# Patient Record
Sex: Female | Born: 1960 | Race: Black or African American | Hispanic: No | Marital: Single | Smoking: Former smoker
Health system: Southern US, Community
[De-identification: ages and names within clinical notes are randomized; demographics above are authoritative.]

## PROBLEM LIST (undated history)

## (undated) DIAGNOSIS — F419 Anxiety disorder, unspecified: Secondary | ICD-10-CM

## (undated) DIAGNOSIS — F32A Depression, unspecified: Secondary | ICD-10-CM

## (undated) DIAGNOSIS — I10 Essential (primary) hypertension: Secondary | ICD-10-CM

## (undated) DIAGNOSIS — E119 Type 2 diabetes mellitus without complications: Secondary | ICD-10-CM

## (undated) DIAGNOSIS — F329 Major depressive disorder, single episode, unspecified: Secondary | ICD-10-CM

## (undated) DIAGNOSIS — J45909 Unspecified asthma, uncomplicated: Secondary | ICD-10-CM

## (undated) DIAGNOSIS — E7800 Pure hypercholesterolemia, unspecified: Secondary | ICD-10-CM

## (undated) HISTORY — PX: ABDOMINAL HYSTERECTOMY: SHX81

## (undated) HISTORY — PX: TUBAL LIGATION: SHX77

## (undated) HISTORY — PX: BUNIONECTOMY: SHX129

## (undated) HISTORY — PX: CHOLECYSTECTOMY: SHX55

## (undated) HISTORY — PX: OTHER SURGICAL HISTORY: SHX169

---
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
## 2000-10-02 ENCOUNTER — Emergency Department (HOSPITAL_COMMUNITY): Admission: EM | Admit: 2000-10-02 | Discharge: 2000-10-02 | Payer: Self-pay | Admitting: *Deleted

## 2000-11-04 ENCOUNTER — Encounter: Payer: Self-pay | Admitting: Emergency Medicine

## 2000-11-04 ENCOUNTER — Emergency Department (HOSPITAL_COMMUNITY): Admission: EM | Admit: 2000-11-04 | Discharge: 2000-11-04 | Payer: Self-pay | Admitting: Emergency Medicine

## 2001-02-03 ENCOUNTER — Encounter: Payer: Self-pay | Admitting: Emergency Medicine

## 2001-02-03 ENCOUNTER — Emergency Department (HOSPITAL_COMMUNITY): Admission: EM | Admit: 2001-02-03 | Discharge: 2001-02-03 | Payer: Self-pay | Admitting: Emergency Medicine

## 2001-09-23 ENCOUNTER — Ambulatory Visit (HOSPITAL_COMMUNITY): Admission: RE | Admit: 2001-09-23 | Discharge: 2001-09-23 | Payer: Self-pay | Admitting: Obstetrics

## 2002-07-11 ENCOUNTER — Encounter: Payer: Self-pay | Admitting: *Deleted

## 2002-07-11 ENCOUNTER — Emergency Department (HOSPITAL_COMMUNITY): Admission: EM | Admit: 2002-07-11 | Discharge: 2002-07-11 | Payer: Self-pay | Admitting: *Deleted

## 2002-07-12 ENCOUNTER — Ambulatory Visit (HOSPITAL_COMMUNITY): Admission: RE | Admit: 2002-07-12 | Discharge: 2002-07-12 | Payer: Self-pay | Admitting: *Deleted

## 2002-07-12 ENCOUNTER — Encounter: Payer: Self-pay | Admitting: *Deleted

## 2002-07-15 ENCOUNTER — Other Ambulatory Visit: Admission: RE | Admit: 2002-07-15 | Discharge: 2002-07-15 | Payer: Self-pay | Admitting: Obstetrics

## 2002-09-25 ENCOUNTER — Emergency Department (HOSPITAL_COMMUNITY): Admission: EM | Admit: 2002-09-25 | Discharge: 2002-09-25 | Payer: Self-pay | Admitting: Emergency Medicine

## 2003-03-29 ENCOUNTER — Encounter: Payer: Self-pay | Admitting: Emergency Medicine

## 2003-03-29 ENCOUNTER — Emergency Department (HOSPITAL_COMMUNITY): Admission: EM | Admit: 2003-03-29 | Discharge: 2003-03-29 | Payer: Self-pay | Admitting: Emergency Medicine

## 2003-05-18 ENCOUNTER — Encounter: Admission: RE | Admit: 2003-05-18 | Discharge: 2003-05-18 | Payer: Self-pay | Admitting: Internal Medicine

## 2003-05-18 ENCOUNTER — Encounter: Payer: Self-pay | Admitting: Internal Medicine

## 2003-07-11 ENCOUNTER — Encounter: Payer: Self-pay | Admitting: Surgery

## 2003-07-19 ENCOUNTER — Observation Stay (HOSPITAL_COMMUNITY): Admission: RE | Admit: 2003-07-19 | Discharge: 2003-07-20 | Payer: Self-pay | Admitting: Surgery

## 2003-07-19 ENCOUNTER — Encounter: Payer: Self-pay | Admitting: Surgery

## 2003-12-26 ENCOUNTER — Emergency Department (HOSPITAL_COMMUNITY): Admission: EM | Admit: 2003-12-26 | Discharge: 2003-12-26 | Payer: Self-pay | Admitting: Emergency Medicine

## 2004-02-16 ENCOUNTER — Emergency Department (HOSPITAL_COMMUNITY): Admission: EM | Admit: 2004-02-16 | Discharge: 2004-02-16 | Payer: Self-pay | Admitting: Emergency Medicine

## 2004-02-29 ENCOUNTER — Emergency Department (HOSPITAL_COMMUNITY): Admission: EM | Admit: 2004-02-29 | Discharge: 2004-02-29 | Payer: Self-pay | Admitting: Emergency Medicine

## 2004-06-17 ENCOUNTER — Emergency Department (HOSPITAL_COMMUNITY): Admission: EM | Admit: 2004-06-17 | Discharge: 2004-06-17 | Payer: Self-pay | Admitting: Emergency Medicine

## 2004-11-07 ENCOUNTER — Ambulatory Visit: Payer: Self-pay | Admitting: Internal Medicine

## 2004-12-21 ENCOUNTER — Ambulatory Visit: Payer: Self-pay | Admitting: Internal Medicine

## 2004-12-24 ENCOUNTER — Ambulatory Visit (HOSPITAL_COMMUNITY): Admission: RE | Admit: 2004-12-24 | Discharge: 2004-12-24 | Payer: Self-pay | Admitting: Internal Medicine

## 2005-01-01 ENCOUNTER — Ambulatory Visit: Payer: Self-pay | Admitting: Family Medicine

## 2005-01-02 ENCOUNTER — Ambulatory Visit (HOSPITAL_COMMUNITY): Admission: RE | Admit: 2005-01-02 | Discharge: 2005-01-02 | Payer: Self-pay | Admitting: Family Medicine

## 2005-03-26 ENCOUNTER — Ambulatory Visit: Payer: Self-pay | Admitting: Internal Medicine

## 2005-05-06 ENCOUNTER — Emergency Department (HOSPITAL_COMMUNITY): Admission: EM | Admit: 2005-05-06 | Discharge: 2005-05-06 | Payer: Self-pay | Admitting: Emergency Medicine

## 2005-05-07 ENCOUNTER — Emergency Department (HOSPITAL_COMMUNITY): Admission: EM | Admit: 2005-05-07 | Discharge: 2005-05-07 | Payer: Self-pay | Admitting: Emergency Medicine

## 2005-07-30 ENCOUNTER — Ambulatory Visit: Payer: Self-pay | Admitting: Internal Medicine

## 2005-08-28 ENCOUNTER — Ambulatory Visit: Payer: Self-pay | Admitting: Internal Medicine

## 2018-07-23 ENCOUNTER — Emergency Department (HOSPITAL_COMMUNITY)
Admission: EM | Admit: 2018-07-23 | Discharge: 2018-07-23 | Disposition: A | Discharge status: Home / Self Care | Payer: Medicare HMO | Attending: Emergency Medicine | Admitting: Emergency Medicine

## 2018-07-23 ENCOUNTER — Encounter (HOSPITAL_COMMUNITY): Payer: Self-pay | Admitting: *Deleted

## 2018-07-23 ENCOUNTER — Emergency Department (HOSPITAL_COMMUNITY): Payer: Medicare HMO

## 2018-07-23 ENCOUNTER — Other Ambulatory Visit: Payer: Self-pay

## 2018-07-23 DIAGNOSIS — I159 Secondary hypertension, unspecified: Secondary | ICD-10-CM | POA: Diagnosis not present

## 2018-07-23 DIAGNOSIS — G43009 Migraine without aura, not intractable, without status migrainosus: Secondary | ICD-10-CM

## 2018-07-23 DIAGNOSIS — R51 Headache: Secondary | ICD-10-CM | POA: Diagnosis not present

## 2018-07-23 HISTORY — DX: Essential (primary) hypertension: I10

## 2018-07-23 LAB — COMPREHENSIVE METABOLIC PANEL
ALBUMIN: 4.1 g/dL (ref 3.5–5.0)
ALT: 23 U/L (ref 0–44)
AST: 23 U/L (ref 15–41)
Alkaline Phosphatase: 76 U/L (ref 38–126)
Anion gap: 8 (ref 5–15)
BILIRUBIN TOTAL: 0.6 mg/dL (ref 0.3–1.2)
BUN: 17 mg/dL (ref 6–20)
CALCIUM: 9.3 mg/dL (ref 8.9–10.3)
CO2: 29 mmol/L (ref 22–32)
Chloride: 106 mmol/L (ref 98–111)
Creatinine, Ser: 0.77 mg/dL (ref 0.44–1.00)
GFR calc Af Amer: >60 mL/min (ref >60)
GLUCOSE: 135 mg/dL — AB (ref 70–99)
POTASSIUM: 3.8 mmol/L (ref 3.5–5.1)
Sodium: 143 mmol/L (ref 135–145)
TOTAL PROTEIN: 7.7 g/dL (ref 6.5–8.1)

## 2018-07-23 LAB — CBC
HEMATOCRIT: 39.5 % (ref 36.0–46.0)
HEMOGLOBIN: 13.1 g/dL (ref 12.0–15.0)
MCH: 27.9 pg (ref 26.0–34.0)
MCHC: 33.2 g/dL (ref 30.0–36.0)
MCV: 84 fL (ref 78.0–100.0)
Platelets: 311 10*3/uL (ref 150–400)
RBC: 4.7 MIL/uL (ref 3.87–5.11)
RDW: 13.8 % (ref 11.5–15.5)
WBC: 6 10*3/uL (ref 4.0–10.5)

## 2018-07-23 LAB — DIFFERENTIAL
BASOS ABS: 0 10*3/uL (ref 0.0–0.1)
Basophils Relative: 0 %
Eosinophils Absolute: 0.1 10*3/uL (ref 0.0–0.7)
Eosinophils Relative: 2 %
LYMPHS ABS: 2.2 10*3/uL (ref 0.7–4.0)
LYMPHS PCT: 37 %
MONOS PCT: 9 %
Monocytes Absolute: 0.6 10*3/uL (ref 0.1–1.0)
NEUTROS PCT: 52 %
Neutro Abs: 3.1 10*3/uL (ref 1.7–7.7)

## 2018-07-23 LAB — I-STAT BETA HCG BLOOD, ED (MC, WL, AP ONLY): I-stat hCG, quantitative: <5 m[IU]/mL (ref <5)

## 2018-07-23 LAB — I-STAT CHEM 8, ED
BUN: 17 mg/dL (ref 6–20)
Calcium, Ion: 1.13 mmol/L — ABNORMAL LOW (ref 1.15–1.40)
Chloride: 103 mmol/L (ref 98–111)
Creatinine, Ser: 0.7 mg/dL (ref 0.44–1.00)
Glucose, Bld: 132 mg/dL — ABNORMAL HIGH (ref 70–99)
HEMATOCRIT: 39 % (ref 36.0–46.0)
Hemoglobin: 13.3 g/dL (ref 12.0–15.0)
Potassium: 3.6 mmol/L (ref 3.5–5.1)
Sodium: 140 mmol/L (ref 135–145)
TCO2: 29 mmol/L (ref 22–32)

## 2018-07-23 LAB — I-STAT TROPONIN, ED
TROPONIN I, POC: 0 ng/mL (ref 0.00–0.08)
Troponin i, poc: 0.01 ng/mL (ref 0.00–0.08)

## 2018-07-23 LAB — PREGNANCY, URINE: PREG TEST UR: NEGATIVE

## 2018-07-23 LAB — APTT: APTT: 29 s (ref 24–36)

## 2018-07-23 LAB — PROTIME-INR
INR: 1
Prothrombin Time: 13.1 seconds (ref 11.4–15.2)

## 2018-07-23 MED ORDER — DIPHENHYDRAMINE HCL 25 MG PO CAPS
25.0000 mg | ORAL_CAPSULE | Freq: Once | ORAL | Status: AC
  Administered 2018-07-23: 25 mg via ORAL
  Filled 2018-07-23: qty 1

## 2018-07-23 MED ORDER — PROCHLORPERAZINE EDISYLATE 10 MG/2ML IJ SOLN
10.0000 mg | Freq: Once | INTRAMUSCULAR | Status: AC
  Administered 2018-07-23: 10 mg via INTRAVENOUS
  Filled 2018-07-23: qty 2

## 2018-07-23 MED ORDER — DEXAMETHASONE 4 MG PO TABS
10.0000 mg | ORAL_TABLET | Freq: Once | ORAL | Status: AC
  Administered 2018-07-23: 10 mg via ORAL
  Filled 2018-07-23: qty 2

## 2018-07-23 MED ORDER — KETOROLAC TROMETHAMINE 15 MG/ML IJ SOLN
15.0000 mg | Freq: Once | INTRAMUSCULAR | Status: AC
  Administered 2018-07-23: 15 mg via INTRAVENOUS
  Filled 2018-07-23: qty 1

## 2018-07-23 MED ORDER — AMLODIPINE BESYLATE 5 MG PO TABS: 5.0000 mg | ORAL_TABLET | Freq: Every day | ORAL | 0 refills | Status: DC

## 2018-07-23 NOTE — ED Triage Notes (Signed)
Pt reports elevated BP with severe h/a x 3 days ago.  She ran out of her HTN meds 4 months ago.  She is A&Ox 4.  Pt reports "stumbling" when ambulating x 2 days.  No slurred speech or facial droop.  Reports L side weakness x 2 days.  BP 180/112.  She reports the reason she stopped taking her HTN meds is because she did not have a doctor.

## 2018-07-23 NOTE — ED Provider Notes (Signed)
[facility]-EMERGENCY DEPT Provider Note   CSN: [id number] Arrival date & time: 07/23/18  1519     History   Chief Complaint Chief Complaint  Patient presents with  . Headache  . Hypertension    HPI Diana ARSENEAU is a 57 y.o. female.   The history is provided by the patient.   Headache    This is a recurrent problem. The current episode started more than 2 days ago. The problem occurs hourly. The problem has been gradually worsening. The headache is associated with emotional stress (hypertension). The pain is located in the bilateral region. The quality of the pain is described as dull. The pain is at a severity of 5/10. The pain is moderate. The pain does not radiate. Pertinent negatives include no anorexia, no fever, no malaise/fatigue, no chest pressure, no near-syncope, no orthopnea, no palpitations, no syncope, no shortness of breath, no nausea and no vomiting. She has tried nothing for the symptoms. The treatment provided no relief.    Past Medical History:  Diagnosis Date  . Hypertension     There are no active problems to display for this patient.   History reviewed. No pertinent surgical history.   OB History   None      Home Medications    Prior to Admission medications   Medication Sig Start Date End Date Taking? Authorizing Provider  amLODipine (NORVASC) 5 MG tablet Take 1 tablet (5 mg total) by mouth daily. 07/23/18 08/22/18  Virgina Norfolk, DO    Family History No family history on file.  Social History Social History   Tobacco Use  . Smoking status: Never Smoker  . Smokeless tobacco: Never Used  Substance Use Topics  . Alcohol use: Never    Frequency: Never  . Drug use: Never     Allergies   Patient has no known allergies.   Review of Systems Review of Systems  Constitutional: Negative for chills, fever and malaise/fatigue.  HENT: Negative for ear pain, sinus pressure, sinus pain and sore throat.   Eyes:  Negative for pain and visual disturbance.  Respiratory: Negative for cough and shortness of breath.   Cardiovascular: Negative for chest pain, palpitations, orthopnea, syncope and near-syncope.  Gastrointestinal: Negative for abdominal pain, anorexia, nausea and vomiting.  Genitourinary: Negative for dysuria and hematuria.  Musculoskeletal: Negative for arthralgias and back pain.  Skin: Negative for color change and rash.  Neurological: Positive for headaches. Negative for dizziness, tremors, seizures, syncope, facial asymmetry, speech difficulty, weakness, light-headedness and numbness.  All other systems reviewed and are negative.    Physical Exam Updated Vital Signs  ED Triage Vitals  Enc Vitals Group     BP 07/23/18 1534 (!) 198/112     Pulse Rate 07/23/18 1534 84     Resp 07/23/18 1534 20     Temp 07/23/18 1534 99.2 F (37.3 C)     Temp Source 07/23/18 1534 Oral     SpO2 07/23/18 1534 100 %     Weight 07/23/18 1554 154 lb (69.9 kg)     Height 07/23/18 1554 5\' 8"  (1.727 m)     Head Circumference --      Peak Flow --      Pain Score 07/23/18 1554 10     Pain Loc --      Pain Edu? --      Excl. in GC? --     Physical Exam  Constitutional: She is oriented to person, place, and time.  She appears well-developed and well-nourished. No distress.  HENT:  Head: Normocephalic and atraumatic.  Eyes: Pupils are equal, round, and reactive to light. Conjunctivae and EOM are normal.  Neck: Normal range of motion. Neck supple.  Cardiovascular: Normal rate and regular rhythm.  No murmur heard. Pulmonary/Chest: Effort normal and breath sounds normal. No respiratory distress.  Abdominal: Soft. There is no tenderness.  Musculoskeletal: She exhibits no edema.  Neurological: She is alert and oriented to person, place, and time. She has normal strength. She is not disoriented. No cranial nerve deficit or sensory deficit.  5+/5 strength, normal sensation, no drift, normal finger to nose  finger  Skin: Skin is warm and dry.  Psychiatric: She has a normal mood and affect.  Nursing note and vitals reviewed.    ED Treatments / Results  Labs (all labs ordered are listed, but only abnormal results are displayed) Labs Reviewed  COMPREHENSIVE METABOLIC PANEL - Abnormal; Notable for the following components:      Result Value   Glucose, Bld 135 (*)    All other components within normal limits  I-STAT CHEM 8, ED - Abnormal; Notable for the following components:   Glucose, Bld 132 (*)    Calcium, Ion 1.13 (*)    All other components within normal limits  PROTIME-INR  APTT  CBC  DIFFERENTIAL  PREGNANCY, URINE  I-STAT TROPONIN, ED  CBG MONITORING, ED  I-STAT BETA HCG BLOOD, ED ([facility], [facility], [facility] ONLY)  I-STAT TROPONIN, ED    EKG EKG Interpretation  Date/Time:  Thursday July 23 2018 16:22:13 EDT Ventricular Rate:  77 PR Interval:    QRS Duration: 96 QT Interval:  406 QTC Calculation: 460 R Axis:   26 Text Interpretation:  Sinus rhythm Nonspecific T abnrm, anterolateral leads Confirmed by Virgina NorfolkAdam, Glada Wickstrom [phone]([id number]) on 07/23/2018 4:37:14 PM   Radiology Ct Head Wo Contrast  Result Date: 07/23/2018 CLINICAL DATA:  Hypertension with severe headache 3 days. Some difficulty ambulating with left-sided weakness 2 days. Stopped taking hypertensive medication. EXAM: CT HEAD WITHOUT CONTRAST TECHNIQUE: Contiguous axial images were obtained from the base of the skull through the vertex without intravenous contrast. COMPARISON:  None. FINDINGS: Brain: Ventricles, cisterns and other CSF spaces are within normal. There is no mass, mass effect, shift of midline structures or acute hemorrhage. No evidence of acute infarction. Minimal basal ganglia calcifications. Vascular: No hyperdense vessel or unexpected calcification. Skull: Normal. Negative for fracture or focal lesion. Sinuses/Orbits: No acute finding. Other: None. IMPRESSION: No acute findings. Electronically Signed   By: Elberta Fortisaniel  Boyle  M.D.   On: 07/23/2018 17:08    Procedures Procedures (including critical care time)  Medications Ordered in ED Medications  prochlorperazine (COMPAZINE) injection 10 mg (10 mg Intravenous Given 07/23/18 1647)  diphenhydrAMINE (BENADRYL) capsule 25 mg (25 mg Oral Given 07/23/18 1647)  ketorolac (TORADOL) 15 MG/ML injection 15 mg (15 mg Intravenous Given 07/23/18 1804)  dexamethasone (DECADRON) tablet 10 mg (10 mg Oral Given 07/23/18 1950)     Initial Impression / Assessment and Plan / ED Course  I have reviewed the triage vital signs and the nursing notes.  Pertinent labs & imaging results that were available during my care of the patient were reviewed by me and considered in my medical decision making (see chart for details).     Acquanetta BellingCheryl B Feggins is a 57 year old female w/ history of hypertension who presents to the ED with headache, hypertension.  Patient with hypertension upon arrival but otherwise normal vitals.  Patient with  headache over the last several days.  States that headache has gradually gotten worse and feels that it is hard to get through her day.  Denies any true weakness, slurred speech, vision changes.  Patient has been without her hypertension medicine for several months as she is without a PCP.  She used to be on amlodipine.  Patient denies any fever, chills.  Exam is overall unremarkable.  Patient with normal neurological exam.  Patient with migraine type headache given history and physical.  Patient does admit to some intermittent chest pain over the last several days as well.  Has a low heart score (2) and will get serial troponins to r/o ACS.  EKG showed sinus rhythm with no signs of ischemic changes.  Patient given headache cocktail with IV Compazine, oral Benadryl, Toradol, Decadron.   Head CT showed no acute findings.  No concern for head bleed given history and physical and CT.  No concern for subarachnoid hemorrhage.  Patient with improvement of blood pressure with  headache medication.  Patient had serial troponins that were within normal limits.  Doubt ACS.  No significant electrolyte abnormalities, acute kidney injury.  No signs of endorgan damage given hypertension. Patient with negative pregnancy test.  Patient likely with complicated migraine.  Recommend continued use of Tylenol Motrin as needed.  Patient given prescription for amlodipine and given information to set up primary care.  Discharged from ED in good condition and told to return to ED if symptoms worsen.  This chart was dictated using voice recognition software.  Despite best efforts to proofread, errors can occur which can change the documentation meaning.  Final Clinical Impressions(s) / ED Diagnoses   Final diagnoses:  Migraine without aura and without status migrainosus, not intractable  Secondary hypertension    ED Discharge Orders         Ordered    amLODipine (NORVASC) 5 MG tablet  Daily     07/23/18 2042          Virgina NorfolkCuratolo, Tierra Divelbiss, DO 07/24/18 [phone]

## 2018-10-27 DIAGNOSIS — E1169 Type 2 diabetes mellitus with other specified complication: Secondary | ICD-10-CM | POA: Diagnosis not present

## 2018-10-27 DIAGNOSIS — I10 Essential (primary) hypertension: Secondary | ICD-10-CM | POA: Diagnosis not present

## 2018-10-27 DIAGNOSIS — F3341 Major depressive disorder, recurrent, in partial remission: Secondary | ICD-10-CM | POA: Diagnosis not present

## 2018-11-13 ENCOUNTER — Other Ambulatory Visit: Payer: Self-pay | Admitting: Internal Medicine

## 2018-11-13 DIAGNOSIS — I10 Essential (primary) hypertension: Secondary | ICD-10-CM | POA: Diagnosis not present

## 2018-11-13 DIAGNOSIS — F3341 Major depressive disorder, recurrent, in partial remission: Secondary | ICD-10-CM | POA: Diagnosis not present

## 2018-11-13 DIAGNOSIS — Z23 Encounter for immunization: Secondary | ICD-10-CM | POA: Diagnosis not present

## 2018-11-13 DIAGNOSIS — Z1239 Encounter for other screening for malignant neoplasm of breast: Secondary | ICD-10-CM | POA: Diagnosis not present

## 2018-11-13 DIAGNOSIS — J4520 Mild intermittent asthma, uncomplicated: Secondary | ICD-10-CM | POA: Diagnosis not present

## 2018-11-13 DIAGNOSIS — Z1231 Encounter for screening mammogram for malignant neoplasm of breast: Secondary | ICD-10-CM

## 2018-11-13 DIAGNOSIS — J3089 Other allergic rhinitis: Secondary | ICD-10-CM | POA: Diagnosis not present

## 2018-11-13 DIAGNOSIS — E1169 Type 2 diabetes mellitus with other specified complication: Secondary | ICD-10-CM | POA: Diagnosis not present

## 2018-11-24 ENCOUNTER — Other Ambulatory Visit: Payer: Self-pay | Admitting: Internal Medicine

## 2018-11-24 ENCOUNTER — Ambulatory Visit
Admission: RE | Admit: 2018-11-24 | Discharge: 2018-11-24 | Disposition: A | Discharge status: Home / Self Care | Payer: Medicare HMO | Source: Ambulatory Visit | Attending: Internal Medicine | Admitting: Internal Medicine

## 2018-11-24 DIAGNOSIS — M25562 Pain in left knee: Secondary | ICD-10-CM

## 2018-11-24 DIAGNOSIS — M5136 Other intervertebral disc degeneration, lumbar region: Secondary | ICD-10-CM | POA: Diagnosis not present

## 2018-11-24 DIAGNOSIS — G894 Chronic pain syndrome: Secondary | ICD-10-CM | POA: Diagnosis not present

## 2018-11-24 DIAGNOSIS — L84 Corns and callosities: Secondary | ICD-10-CM | POA: Diagnosis not present

## 2018-11-24 DIAGNOSIS — G8929 Other chronic pain: Secondary | ICD-10-CM | POA: Diagnosis not present

## 2018-12-03 ENCOUNTER — Ambulatory Visit: Payer: Self-pay | Admitting: Podiatry

## 2018-12-22 DIAGNOSIS — F329 Major depressive disorder, single episode, unspecified: Secondary | ICD-10-CM | POA: Diagnosis not present

## 2018-12-22 DIAGNOSIS — I10 Essential (primary) hypertension: Secondary | ICD-10-CM | POA: Diagnosis not present

## 2018-12-22 DIAGNOSIS — Z0000 Encounter for general adult medical examination without abnormal findings: Secondary | ICD-10-CM | POA: Diagnosis not present

## 2018-12-22 DIAGNOSIS — Z111 Encounter for screening for respiratory tuberculosis: Secondary | ICD-10-CM | POA: Diagnosis not present

## 2018-12-22 DIAGNOSIS — E785 Hyperlipidemia, unspecified: Secondary | ICD-10-CM | POA: Diagnosis not present

## 2018-12-22 DIAGNOSIS — E1140 Type 2 diabetes mellitus with diabetic neuropathy, unspecified: Secondary | ICD-10-CM | POA: Diagnosis not present

## 2018-12-22 DIAGNOSIS — J4520 Mild intermittent asthma, uncomplicated: Secondary | ICD-10-CM | POA: Diagnosis not present

## 2019-01-01 DIAGNOSIS — R1110 Vomiting, unspecified: Secondary | ICD-10-CM | POA: Diagnosis not present

## 2019-01-01 DIAGNOSIS — Z560 Unemployment, unspecified: Secondary | ICD-10-CM | POA: Diagnosis not present

## 2019-01-01 DIAGNOSIS — R197 Diarrhea, unspecified: Secondary | ICD-10-CM | POA: Diagnosis not present

## 2019-01-01 DIAGNOSIS — F251 Schizoaffective disorder, depressive type: Secondary | ICD-10-CM | POA: Diagnosis not present

## 2019-01-01 DIAGNOSIS — F17200 Nicotine dependence, unspecified, uncomplicated: Secondary | ICD-10-CM | POA: Diagnosis not present

## 2019-01-01 DIAGNOSIS — R112 Nausea with vomiting, unspecified: Secondary | ICD-10-CM | POA: Diagnosis not present

## 2019-01-01 DIAGNOSIS — F1420 Cocaine dependence, uncomplicated: Secondary | ICD-10-CM | POA: Diagnosis not present

## 2019-01-01 DIAGNOSIS — R109 Unspecified abdominal pain: Secondary | ICD-10-CM | POA: Diagnosis not present

## 2019-01-01 DIAGNOSIS — I10 Essential (primary) hypertension: Secondary | ICD-10-CM | POA: Diagnosis not present

## 2019-01-05 DIAGNOSIS — R1013 Epigastric pain: Secondary | ICD-10-CM | POA: Diagnosis not present

## 2019-01-05 DIAGNOSIS — E119 Type 2 diabetes mellitus without complications: Secondary | ICD-10-CM | POA: Diagnosis not present

## 2019-01-05 DIAGNOSIS — F419 Anxiety disorder, unspecified: Secondary | ICD-10-CM | POA: Diagnosis not present

## 2019-01-05 DIAGNOSIS — I10 Essential (primary) hypertension: Secondary | ICD-10-CM | POA: Diagnosis not present

## 2019-01-12 DIAGNOSIS — J069 Acute upper respiratory infection, unspecified: Secondary | ICD-10-CM | POA: Diagnosis not present

## 2019-01-12 DIAGNOSIS — E119 Type 2 diabetes mellitus without complications: Secondary | ICD-10-CM | POA: Diagnosis not present

## 2019-01-20 DIAGNOSIS — F411 Generalized anxiety disorder: Secondary | ICD-10-CM | POA: Diagnosis not present

## 2019-01-20 DIAGNOSIS — F329 Major depressive disorder, single episode, unspecified: Secondary | ICD-10-CM | POA: Diagnosis not present

## 2019-01-20 DIAGNOSIS — F4312 Post-traumatic stress disorder, chronic: Secondary | ICD-10-CM | POA: Diagnosis not present

## 2019-01-20 DIAGNOSIS — F1020 Alcohol dependence, uncomplicated: Secondary | ICD-10-CM | POA: Diagnosis not present

## 2019-01-20 DIAGNOSIS — F1411 Cocaine abuse, in remission: Secondary | ICD-10-CM | POA: Diagnosis not present

## 2019-01-26 DIAGNOSIS — L509 Urticaria, unspecified: Secondary | ICD-10-CM | POA: Diagnosis not present

## 2019-01-26 DIAGNOSIS — E785 Hyperlipidemia, unspecified: Secondary | ICD-10-CM | POA: Diagnosis not present

## 2019-01-26 DIAGNOSIS — I10 Essential (primary) hypertension: Secondary | ICD-10-CM | POA: Diagnosis not present

## 2019-01-26 DIAGNOSIS — Z113 Encounter for screening for infections with a predominantly sexual mode of transmission: Secondary | ICD-10-CM | POA: Diagnosis not present

## 2019-01-26 DIAGNOSIS — F419 Anxiety disorder, unspecified: Secondary | ICD-10-CM | POA: Diagnosis not present

## 2019-01-26 DIAGNOSIS — Z0184 Encounter for antibody response examination: Secondary | ICD-10-CM | POA: Diagnosis not present

## 2019-01-26 DIAGNOSIS — J4520 Mild intermittent asthma, uncomplicated: Secondary | ICD-10-CM | POA: Diagnosis not present

## 2019-01-26 DIAGNOSIS — Z0000 Encounter for general adult medical examination without abnormal findings: Secondary | ICD-10-CM | POA: Diagnosis not present

## 2019-01-26 DIAGNOSIS — Z1329 Encounter for screening for other suspected endocrine disorder: Secondary | ICD-10-CM | POA: Diagnosis not present

## 2019-01-26 DIAGNOSIS — E119 Type 2 diabetes mellitus without complications: Secondary | ICD-10-CM | POA: Diagnosis not present

## 2019-01-26 DIAGNOSIS — Z111 Encounter for screening for respiratory tuberculosis: Secondary | ICD-10-CM | POA: Diagnosis not present

## 2020-01-13 IMAGING — CT CT HEAD W/O CM
3 series · 14 of 47 positions shown, 16 images · non-contrast
Comparison: None.

CLINICAL DATA: Hypertension with severe headache 3 days. Some
difficulty ambulating with left-sided weakness 2 days. Stopped
taking hypertensive medication.

EXAM:
CT HEAD WITHOUT CONTRAST
TECHNIQUE: Contiguous axial images were obtained from the base of the skull
through the vertex without intravenous contrast.

[Series 2: head wo · axial · 0.47mm/px · z∈[-110,+15]mm · 8 of 30 slices shown, 10 images]
[im 3/30  brain]
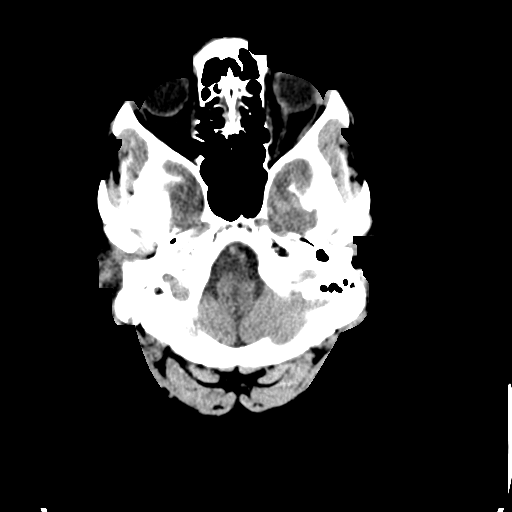
[im 3/30  bone]
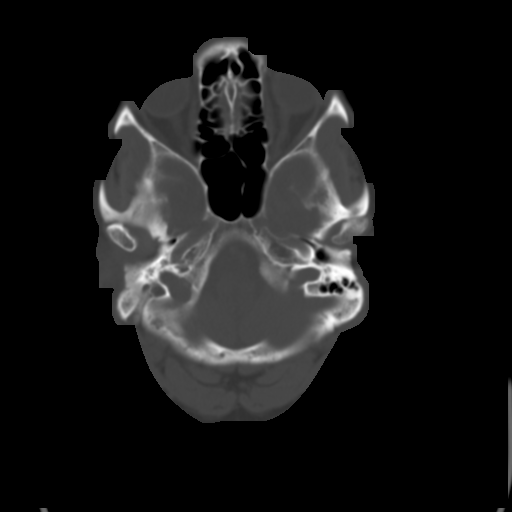
[im 7/30  brain]
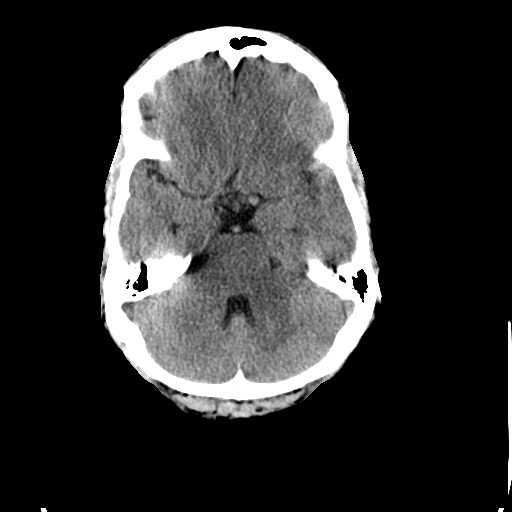
[im 10/30  brain]
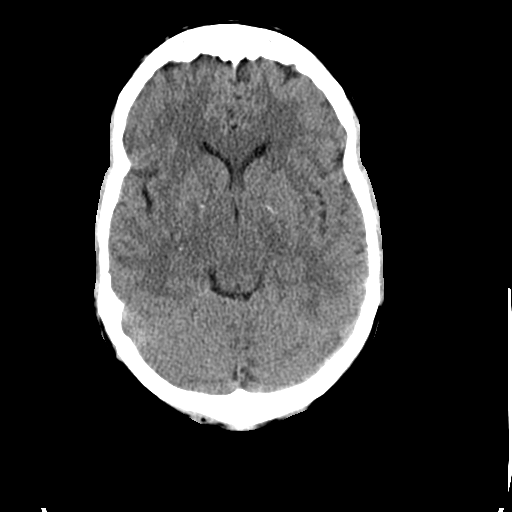
[im 14/30  brain]
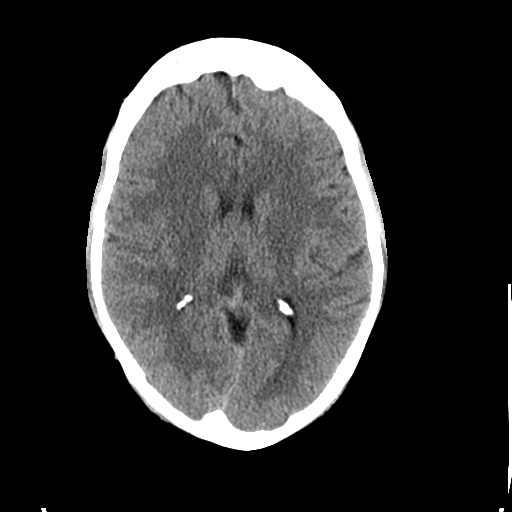
[im 17/30  brain]
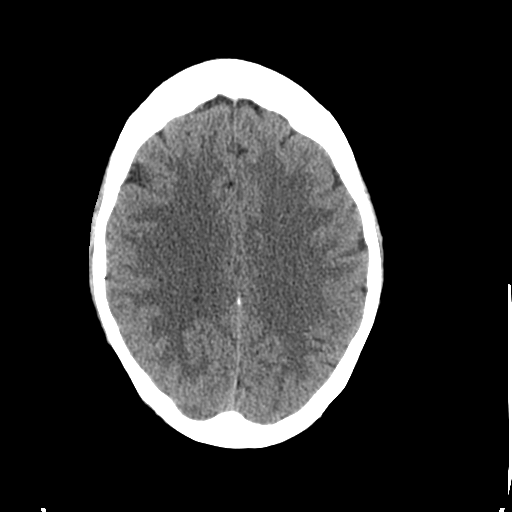
[im 17/30  bone]
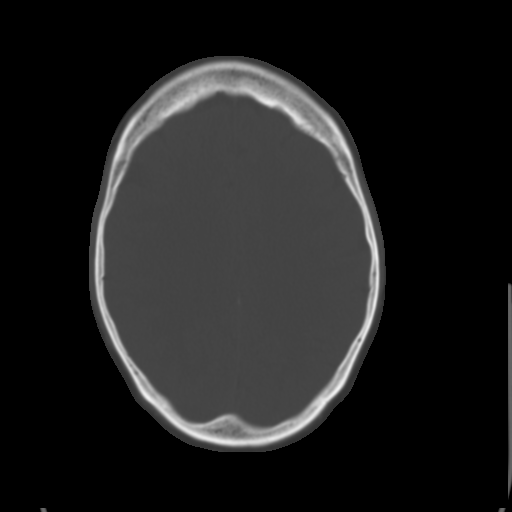
[im 21/30  brain]
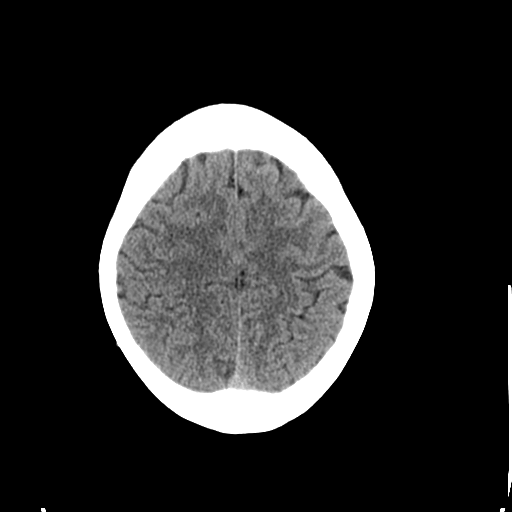
[im 24/30  brain]
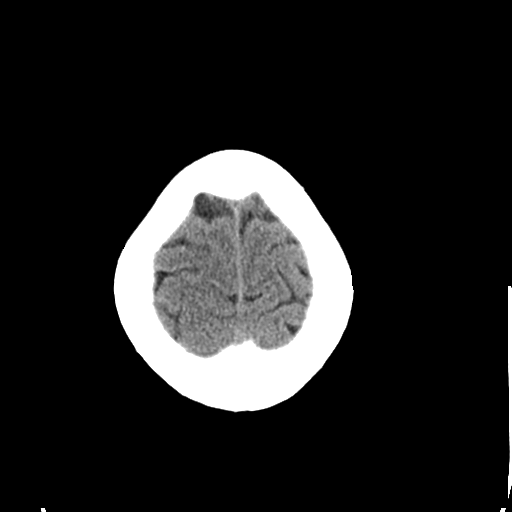
[im 28/30  brain]
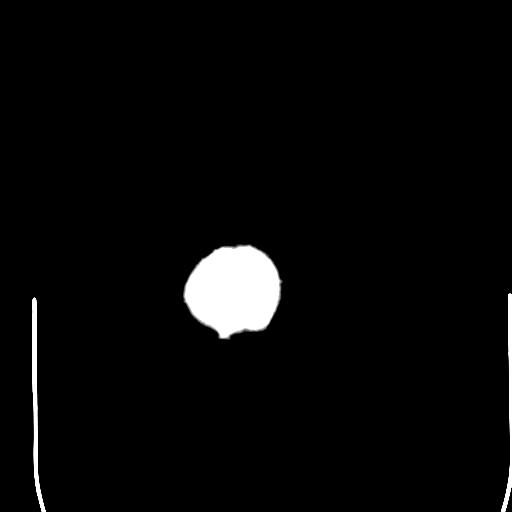

[Series 5: coronal soft tissue · coronal · 0.29mm/px · 3 of 77 slices shown]
[im 26/77  brain]
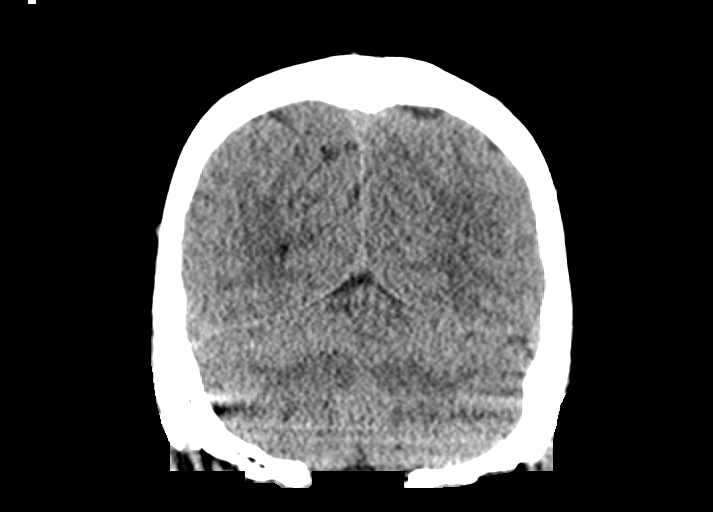
[im 34/77  brain]
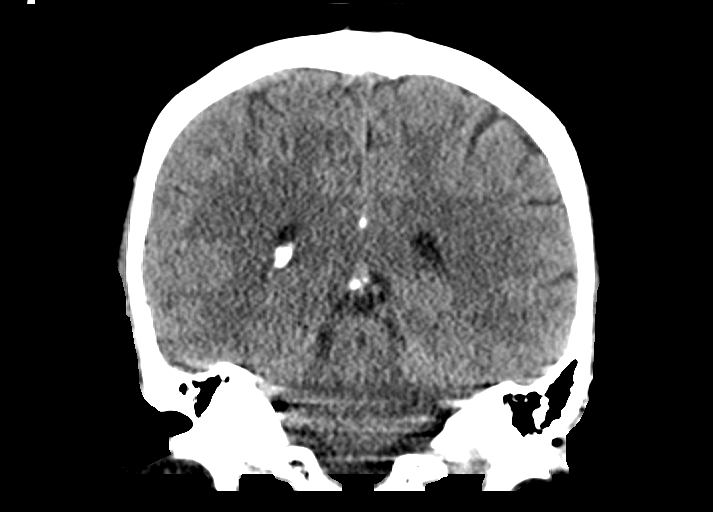
[im 43/77  brain]
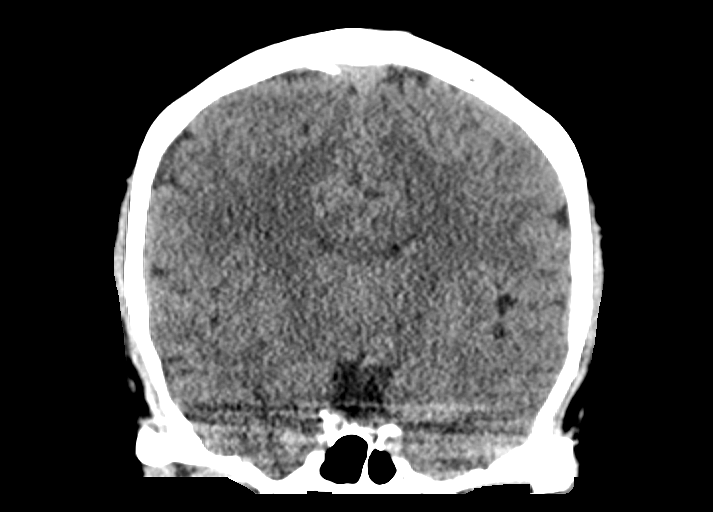

[Series 6: sagittal soft tissue · sagittal · 0.29mm/px · 3 of 60 slices shown]
[im 20/60  brain]
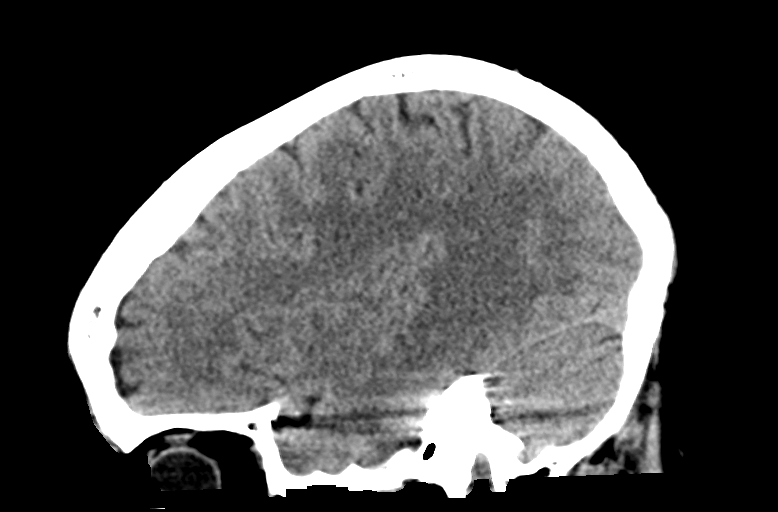
[im 30/60  brain]
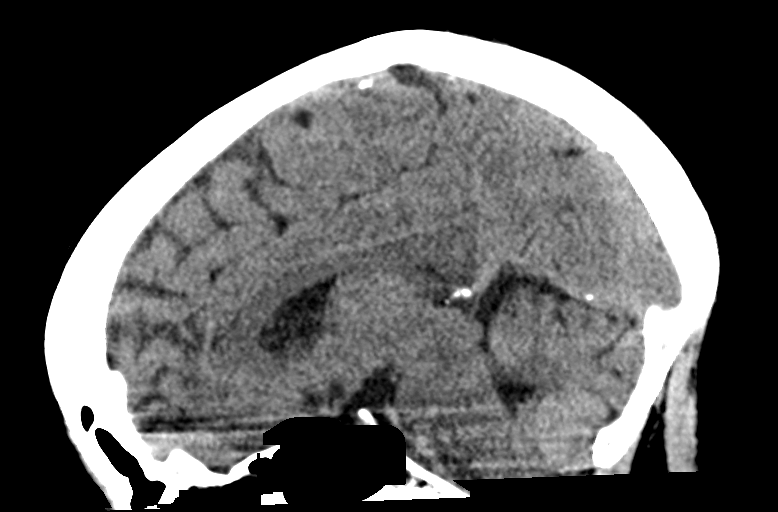
[im 40/60  brain]
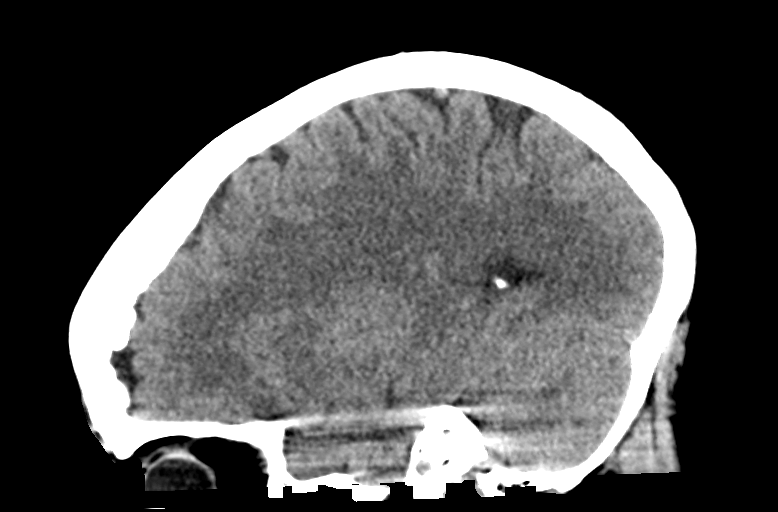

[14 of 47 positions shown; findings below may reference images not displayed]

FINDINGS: Brain: Ventricles, cisterns and other CSF spaces are within normal.
There is no mass, mass effect, shift of midline structures or acute
hemorrhage. No evidence of acute infarction. Minimal basal ganglia
calcifications.

Vascular: No hyperdense vessel or unexpected calcification.

Skull: Normal. Negative for fracture or focal lesion.

Sinuses/Orbits: No acute finding.

Other: None.
IMPRESSION: No acute findings.

## 2020-02-09 ENCOUNTER — Encounter: Payer: Self-pay | Admitting: Internal Medicine

## 2020-02-09 ENCOUNTER — Ambulatory Visit: Payer: Medicare HMO

## 2020-03-04 ENCOUNTER — Emergency Department (HOSPITAL_COMMUNITY): Payer: Medicare (Managed Care)

## 2020-03-04 ENCOUNTER — Inpatient Hospital Stay (HOSPITAL_COMMUNITY)
Admission: EM | Admit: 2020-03-04 | Discharge: 2020-03-08 | DRG: 392 | Disposition: A | Discharge status: Home / Self Care | Payer: Medicare (Managed Care) | Attending: Family Medicine | Admitting: Family Medicine

## 2020-03-04 ENCOUNTER — Encounter (HOSPITAL_COMMUNITY): Payer: Self-pay | Admitting: Emergency Medicine

## 2020-03-04 ENCOUNTER — Other Ambulatory Visit: Payer: Self-pay

## 2020-03-04 DIAGNOSIS — K5792 Diverticulitis of intestine, part unspecified, without perforation or abscess without bleeding: Secondary | ICD-10-CM | POA: Diagnosis not present

## 2020-03-04 DIAGNOSIS — Z20822 Contact with and (suspected) exposure to covid-19: Secondary | ICD-10-CM | POA: Diagnosis present

## 2020-03-04 DIAGNOSIS — E119 Type 2 diabetes mellitus without complications: Secondary | ICD-10-CM

## 2020-03-04 DIAGNOSIS — K5732 Diverticulitis of large intestine without perforation or abscess without bleeding: Secondary | ICD-10-CM | POA: Diagnosis not present

## 2020-03-04 DIAGNOSIS — Z79899 Other long term (current) drug therapy: Secondary | ICD-10-CM

## 2020-03-04 DIAGNOSIS — Z7951 Long term (current) use of inhaled steroids: Secondary | ICD-10-CM

## 2020-03-04 DIAGNOSIS — I10 Essential (primary) hypertension: Secondary | ICD-10-CM | POA: Diagnosis present

## 2020-03-04 DIAGNOSIS — Z90711 Acquired absence of uterus with remaining cervical stump: Secondary | ICD-10-CM

## 2020-03-04 DIAGNOSIS — E1169 Type 2 diabetes mellitus with other specified complication: Secondary | ICD-10-CM

## 2020-03-04 DIAGNOSIS — Z7984 Long term (current) use of oral hypoglycemic drugs: Secondary | ICD-10-CM

## 2020-03-04 DIAGNOSIS — E785 Hyperlipidemia, unspecified: Secondary | ICD-10-CM | POA: Diagnosis present

## 2020-03-04 DIAGNOSIS — R112 Nausea with vomiting, unspecified: Secondary | ICD-10-CM | POA: Diagnosis not present

## 2020-03-04 DIAGNOSIS — E1165 Type 2 diabetes mellitus with hyperglycemia: Secondary | ICD-10-CM | POA: Diagnosis present

## 2020-03-04 HISTORY — DX: Type 2 diabetes mellitus without complications: E11.9

## 2020-03-04 LAB — SARS CORONAVIRUS 2 (TAT 6-24 HRS): SARS Coronavirus 2: NEGATIVE

## 2020-03-04 LAB — COMPREHENSIVE METABOLIC PANEL
ALT: 15 U/L (ref 0–44)
AST: 18 U/L (ref 15–41)
Albumin: 3.9 g/dL (ref 3.5–5.0)
Alkaline Phosphatase: 92 U/L (ref 38–126)
Anion gap: 10 (ref 5–15)
BUN: 13 mg/dL (ref 6–20)
CO2: 25 mmol/L (ref 22–32)
Calcium: 9.1 mg/dL (ref 8.9–10.3)
Chloride: 106 mmol/L (ref 98–111)
Creatinine, Ser: 0.81 mg/dL (ref 0.44–1.00)
GFR calc Af Amer: >60 mL/min (ref >60)
GFR calc non Af Amer: >60 mL/min (ref >60)
Glucose, Bld: 119 mg/dL — ABNORMAL HIGH (ref 70–99)
Potassium: 4.1 mmol/L (ref 3.5–5.1)
Sodium: 141 mmol/L (ref 135–145)
Total Bilirubin: 0.9 mg/dL (ref 0.3–1.2)
Total Protein: 7.4 g/dL (ref 6.5–8.1)

## 2020-03-04 LAB — CBC
HCT: 39.3 % (ref 36.0–46.0)
Hemoglobin: 12.3 g/dL (ref 12.0–15.0)
MCH: 27.5 pg (ref 26.0–34.0)
MCHC: 31.3 g/dL (ref 30.0–36.0)
MCV: 87.7 fL (ref 80.0–100.0)
Platelets: 298 10*3/uL (ref 150–400)
RBC: 4.48 MIL/uL (ref 3.87–5.11)
RDW: 14.3 % (ref 11.5–15.5)
WBC: 9.3 10*3/uL (ref 4.0–10.5)
nRBC: 0 % (ref 0.0–0.2)

## 2020-03-04 LAB — URINALYSIS, ROUTINE W REFLEX MICROSCOPIC
Bilirubin Urine: NEGATIVE
Glucose, UA: NEGATIVE mg/dL
Hgb urine dipstick: NEGATIVE
Ketones, ur: NEGATIVE mg/dL
Leukocytes,Ua: NEGATIVE
Nitrite: NEGATIVE
Protein, ur: NEGATIVE mg/dL
Specific Gravity, Urine: 1.041 — ABNORMAL HIGH (ref 1.005–1.030)
pH: 5 (ref 5.0–8.0)

## 2020-03-04 LAB — LIPASE, BLOOD: Lipase: 19 U/L (ref 11–51)

## 2020-03-04 MED ORDER — SODIUM CHLORIDE 0.9 % IV SOLN: INTRAVENOUS | Status: AC

## 2020-03-04 MED ORDER — SODIUM CHLORIDE 0.9% FLUSH
3.0000 mL | Freq: Once | INTRAVENOUS | Status: AC
  Administered 2020-03-04: 15:00:00 3 mL via INTRAVENOUS

## 2020-03-04 MED ORDER — SODIUM CHLORIDE 0.9 % IV BOLUS
1000.0000 mL | Freq: Once | INTRAVENOUS | Status: AC
  Administered 2020-03-04: 1000 mL via INTRAVENOUS

## 2020-03-04 MED ORDER — PIPERACILLIN-TAZOBACTAM 3.375 G IVPB 30 MIN: 3.3750 g | Freq: Once | INTRAVENOUS | Status: DC

## 2020-03-04 MED ORDER — IOHEXOL 300 MG/ML  SOLN
100.0000 mL | Freq: Once | INTRAMUSCULAR | Status: AC | PRN
  Administered 2020-03-04: 14:00:00 100 mL via INTRAVENOUS

## 2020-03-04 MED ORDER — MORPHINE SULFATE (PF) 4 MG/ML IV SOLN
4.0000 mg | Freq: Once | INTRAVENOUS | Status: AC
  Administered 2020-03-04: 13:00:00 4 mg via INTRAVENOUS
  Filled 2020-03-04: qty 1

## 2020-03-04 MED ORDER — MORPHINE SULFATE (PF) 4 MG/ML IV SOLN
4.0000 mg | Freq: Once | INTRAVENOUS | Status: AC
  Administered 2020-03-04: 17:00:00 4 mg via INTRAVENOUS
  Filled 2020-03-04: qty 1

## 2020-03-04 MED ORDER — ACETAMINOPHEN 650 MG RE SUPP: 650.0000 mg | Freq: Four times a day (QID) | RECTAL | Status: DC | PRN

## 2020-03-04 MED ORDER — HYDROMORPHONE HCL 1 MG/ML IJ SOLN
0.5000 mg | INTRAMUSCULAR | Status: DC | PRN
  Administered 2020-03-04 – 2020-03-05 (×2): 0.5 mg via INTRAVENOUS
  Filled 2020-03-04 (×2): qty 1

## 2020-03-04 MED ORDER — ACETAMINOPHEN 325 MG PO TABS
650.0000 mg | ORAL_TABLET | Freq: Four times a day (QID) | ORAL | Status: DC | PRN
  Administered 2020-03-05: 08:00:00 650 mg via ORAL
  Filled 2020-03-04: qty 2

## 2020-03-04 MED ORDER — AMLODIPINE BESYLATE 5 MG PO TABS
5.0000 mg | ORAL_TABLET | Freq: Every day | ORAL | Status: DC
Start: 2020-03-05 — End: 2020-03-08
  Administered 2020-03-05 – 2020-03-08 (×4): 5 mg via ORAL
  Filled 2020-03-04 (×4): qty 1

## 2020-03-04 MED ORDER — KETOROLAC TROMETHAMINE 30 MG/ML IJ SOLN
30.0000 mg | Freq: Four times a day (QID) | INTRAMUSCULAR | Status: DC | PRN
  Administered 2020-03-04 – 2020-03-05 (×3): 30 mg via INTRAVENOUS
  Filled 2020-03-04 (×3): qty 1

## 2020-03-04 MED ORDER — KETOROLAC TROMETHAMINE 30 MG/ML IJ SOLN
30.0000 mg | Freq: Once | INTRAMUSCULAR | Status: AC
  Administered 2020-03-04: 30 mg via INTRAVENOUS
  Filled 2020-03-04: qty 1

## 2020-03-04 MED ORDER — PIPERACILLIN-TAZOBACTAM 3.375 G IVPB
3.3750 g | Freq: Three times a day (TID) | INTRAVENOUS | Status: DC
  Administered 2020-03-05 – 2020-03-06 (×2): 3.375 g via INTRAVENOUS
  Filled 2020-03-04 (×2): qty 50

## 2020-03-04 MED ORDER — ONDANSETRON HCL 4 MG/2ML IJ SOLN: 4.0000 mg | Freq: Four times a day (QID) | INTRAMUSCULAR | Status: DC | PRN

## 2020-03-04 MED ORDER — PIPERACILLIN-TAZOBACTAM 3.375 G IVPB 30 MIN
3.3750 g | Freq: Once | INTRAVENOUS | Status: AC
  Administered 2020-03-04: 3.375 g via INTRAVENOUS
  Filled 2020-03-04: qty 50

## 2020-03-04 NOTE — Consult Note (Signed)
Reason for Consult:diverticulitis Referring Physician: P Zhang  Diana Mendez is an 59 y.o. female.  HPI: 59 year old female with a history of previous hospitalization for diverticulitis presented to the emergency department with a 2-day history of left lower quadrant abdominal pain.  It was initially episodic but became worse.  She had some associated nausea.  Normal bowel movement yesterday.  Work-up in the emergency department included CT scan of the abdomen and pelvis showing uncomplicated sigmoid diverticulitis.  I was asked to see her from a surgical standpoint.  Past Medical History:  Diagnosis Date  . Diabetes mellitus without complication (HCC)   . Hypertension     History reviewed. No pertinent surgical history.  No family history on file.  Social History:  reports that she has never smoked. She has never used smokeless tobacco. She reports that she does not drink alcohol or use drugs.  Allergies:  Allergies  Allergen Reactions  . Flexeril [Cyclobenzaprine] Anaphylaxis  . Lisinopril Diarrhea    Medications: I have reviewed the patient's current medications.  Results for orders placed or performed during the hospital encounter of 03/04/20 (from the past 48 hour(s))  Lipase, blood     Status: None   Collection Time: 03/04/20 11:23 AM  Result Value Ref Range   Lipase 19 11 - 51 U/L    Comment: Performed at [facility], [address], [place], [place] [address]  Comprehensive metabolic panel     Status: Abnormal   Collection Time: 03/04/20 11:23 AM  Result Value Ref Range   Sodium 141 135 - 145 mmol/L   Potassium 4.1 3.5 - 5.1 mmol/L   Chloride 106 98 - 111 mmol/L   CO2 25 22 - 32 mmol/L   Glucose, Bld 119 (H) 70 - 99 mg/dL    Comment: Glucose reference range applies only to samples taken after fasting for at least 8 hours.   BUN 13 6 - 20 mg/dL   Creatinine, Ser 8.75 0.44 - 1.00 mg/dL   Calcium 9.1 8.9 - 64.3 mg/dL   Total Protein 7.4 6.5 - 8.1 g/dL    Albumin 3.9 3.5 - 5.0 g/dL   AST 18 15 - 41 U/L   ALT 15 0 - 44 U/L   Alkaline Phosphatase 92 38 - 126 U/L   Total Bilirubin 0.9 0.3 - 1.2 mg/dL   GFR calc non Af Amer >60 >60 mL/min   GFR calc Af Amer >60 >60 mL/min   Anion gap 10 5 - 15    Comment: Performed at [facility], [address], [place], [place] [address]  CBC     Status: None   Collection Time: 03/04/20 11:23 AM  Result Value Ref Range   WBC 9.3 4.0 - 10.5 K/uL   RBC 4.48 3.87 - 5.11 MIL/uL   Hemoglobin 12.3 12.0 - 15.0 g/dL   HCT 88.4 16.6 - 06.3 %   MCV 87.7 80.0 - 100.0 fL   MCH 27.5 26.0 - 34.0 pg   MCHC 31.3 30.0 - 36.0 g/dL   RDW 01.6 01.0 - 93.2 %   Platelets 298 150 - 400 K/uL   nRBC 0.0 0.0 - 0.2 %    Comment: Performed at [facility], [address], [place], [place] [address]  Urinalysis, Routine w reflex microscopic     Status: Abnormal   Collection Time: 03/04/20  2:22 PM  Result Value Ref Range   Color, Urine YELLOW YELLOW   APPearance CLEAR CLEAR  Specific Gravity, Urine 1.041 (H) 1.005 - 1.030   pH 5.0 5.0 - 8.0   Glucose, UA NEGATIVE NEGATIVE mg/dL   Hgb urine dipstick NEGATIVE NEGATIVE   Bilirubin Urine NEGATIVE NEGATIVE   Ketones, ur NEGATIVE NEGATIVE mg/dL   Protein, ur NEGATIVE NEGATIVE mg/dL   Nitrite NEGATIVE NEGATIVE   Leukocytes,Ua NEGATIVE NEGATIVE    Comment: Performed at [facility] [address] [place], [place] [address]    CT ABDOMEN PELVIS W CONTRAST  Result Date: 03/04/2020 CLINICAL DATA:  Lower abdominal pain for 2 days with nausea. Intermittent urinary incontinence for 2 months. EXAM: CT ABDOMEN AND PELVIS WITH CONTRAST TECHNIQUE: Multidetector CT imaging of the abdomen and pelvis was performed using the standard protocol following bolus administration of intravenous contrast. CONTRAST:  167mL OMNIPAQUE IOHEXOL 300 MG/ML  SOLN COMPARISON:  05/18/2003 CT abdomen/pelvis. 12/24/2004 CT pelvis. FINDINGS: Lower chest: No significant pulmonary nodules or  acute consolidative airspace disease. Hepatobiliary: Normal liver size. No liver mass. Cholecystectomy. Bile ducts are within normal post cholecystectomy limits. 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 No pancreatic mass. Spleen: Normal size. No mass. Adrenals/Urinary Tract: Normal adrenals. Nonobstructing 3 mm lower right renal stone. Otherwise normal kidneys, with no hydronephrosis and no renal masses. Normal bladder. Stomach/Bowel: Normal non-distended stomach. Normal caliber small bowel with no small bowel wall thickening. Normal appendix. Moderate diffuse colonic diverticulosis, most prominent in the sigmoid colon. Segmental wall thickening in mid sigmoid colon with associated pericolonic fat stranding, compatible with acute sigmoid diverticulitis (series 3/image 67). No pericolonic free air or abscess. Vascular/Lymphatic: Atherosclerotic nonaneurysmal abdominal aorta. Patent portal, splenic, hepatic and renal veins. No pathologically enlarged lymph nodes in the abdomen or pelvis. Reproductive: Status post hysterectomy, with no abnormal findings at the vaginal cuff. No adnexal mass. Other: No pneumoperitoneum, ascites or focal fluid collection. Musculoskeletal: No aggressive appearing focal osseous lesions. Mild thoracolumbar spondylosis. IMPRESSION: 1. Acute sigmoid diverticulitis. No free air or abscess. 2. Nonobstructing right nephrolithiasis. 3. Aortic Atherosclerosis (ICD10-I70.0). Electronically Signed   By: Ilona Sorrel M.D.   On: 03/04/2020 14:16    Review of Systems  Constitutional: Negative.   HENT: Negative.   Eyes: Negative.   Respiratory: Negative.   Cardiovascular: Negative.   Gastrointestinal: Positive for abdominal pain and nausea. Negative for vomiting.  Endocrine: Negative.   Musculoskeletal: Negative.   Skin: Negative.   Allergic/Immunologic: Negative.   Neurological: Negative.    Hematological: Negative.   Psychiatric/Behavioral: Negative.    Blood pressure (!) 143/89, pulse 86, temperature 98.9 F (37.2 C), temperature source Oral, resp. rate 19, SpO2 100 %. Physical Exam  Constitutional: She is oriented to person, place, and time. She appears well-developed and well-nourished. No distress.  HENT:  Head: Normocephalic.  Right Ear: External ear normal.  Left Ear: External ear normal.  Mouth/Throat: Oropharynx is clear and moist.  Eyes: Pupils are equal, round, and reactive to light. EOM are normal. Right eye exhibits no discharge. Left eye exhibits no discharge. No scleral icterus.  Neck: No tracheal deviation present. No thyromegaly present.  Cardiovascular: Normal rate, regular rhythm, normal heart sounds and intact distal pulses.  Respiratory: Effort normal and breath sounds normal. No respiratory distress. She has no wheezes. She has no rales.  GI: Soft. She exhibits no distension. There is abdominal tenderness. There is no rebound and no guarding.  No hepatosplenomegaly  Musculoskeletal:        General: No  tenderness or edema. Normal range of motion.     Cervical back: Neck supple.  Neurological: She is alert and oriented to person, place, and time. She exhibits normal muscle tone.  Skin: Skin is warm and dry.  Psychiatric: She has a normal mood and affect.  Alert and oriented    Assessment/Plan: Sigmoid diverticulitis without perforation or abscess -recommend bowel rest, IV antibiotics.  No need for emergent surgery.  Hopefully she can be treated medically this admission and plan follow-up with one of our colorectal surgeons.  We will continue to follow with you.  Liz Malady 03/04/2020, 9:02 PM

## 2020-03-04 NOTE — ED Provider Notes (Signed)
[facility] EMERGENCY DEPARTMENT Provider Note   CSN: [id number] Arrival date & time: 03/04/20  1113     History Chief Complaint  Patient presents with  . Abdominal Pain    Diana Mendez is a 59 y.o. female with PMH significant for HTN and type II DM on Metformin who presents to the ED with 2-day history of lower abdominal discomfort with associated nausea.  She also endorses a 76-month history of small amount of urinary incontinence.  Patient reports that she has had 8 kids and she is describing a stress incontinence, particularly when she coughs or sneezes.  She reports that approximately 7 years ago while living in [place] she was hospitalized for 4 days due to diverticulitis.  She states that this feels very similar, but more significant discomfort.  Her abdominal pain is in her LLQ with radiation across her lower abdomen.  She denies any current nausea symptoms here in the ED, but states she has not eaten anything since yesterday.  Her last bowel movement was this morning and was neither bloody, nor melanotic.  She denies any obvious fevers or chills, chest pain or difficulty breathing, hematuria, dysuria, other urinary symptoms, vaginal discharge or bleeding, pain with defecation, or changes in bowel habits.  She has a history of partial hysterectomy, but no other abdominal surgeries.  Her pain right now is 10 out of 10 and she describes it as worse than when she was giving birth.  HPI     Past Medical History:  Diagnosis Date  . Diabetes mellitus without complication (HCC)   . Hypertension     There are no problems to display for this patient.   History reviewed. No pertinent surgical history.   OB History   No obstetric history on file.     No family history on file.  Social History   Tobacco Use  . Smoking status: Never Smoker  . Smokeless tobacco: Never Used  Substance Use Topics  . Alcohol use: Never  . Drug use: Never    Home  Medications Prior to Admission medications   Medication Sig Start Date End Date Taking? Authorizing Provider  amLODipine (NORVASC) 5 MG tablet Take 1 tablet (5 mg total) by mouth daily. 07/23/18 08/22/18  Virgina Norfolk, DO    Allergies    Patient has no known allergies.  Review of Systems   Review of Systems  Constitutional: Negative for fever.  Respiratory: Negative for shortness of breath.   Cardiovascular: Negative for chest pain.  Gastrointestinal: Positive for abdominal pain. Negative for vomiting.  Genitourinary: Negative for dysuria and vaginal pain.  Musculoskeletal: Negative for back pain.    Physical Exam Updated Vital Signs BP (!) 161/95   Pulse 87   Temp 99.8 F (37.7 C) (Oral)   Resp 20   SpO2 100%   Physical Exam Vitals and nursing note reviewed. Exam conducted with a chaperone present.  Constitutional:      Comments: In obvious discomfort.  HENT:     Head: Normocephalic and atraumatic.  Eyes:     General: No scleral icterus.    Conjunctiva/sclera: Conjunctivae normal.  Cardiovascular:     Rate and Rhythm: Normal rate and regular rhythm.     Pulses: Normal pulses.     Heart sounds: Normal heart sounds.  Pulmonary:     Effort: Pulmonary effort is normal. No respiratory distress.     Breath sounds: Normal breath sounds. No wheezing or rales.  Abdominal:  Comments: Soft, nondistended.  Significant TTP in LLQ with mild guarding.  TTP in LUQ, suprapubic region, and RUQ, as well.  No TTP elsewhere.  No overlying skin changes.  Normoactive bowel sounds.  Musculoskeletal:     Cervical back: Normal range of motion.  Skin:    General: Skin is dry.     Capillary Refill: Capillary refill takes less than 2 seconds.  Neurological:     Mental Status: She is alert and oriented to person, place, and time.     GCS: GCS eye subscore is 4. GCS verbal subscore is 5. GCS motor subscore is 6.  Psychiatric:        Mood and Affect: Mood normal.        Behavior: Behavior  normal.        Thought Content: Thought content normal.     ED Results / Procedures / Treatments   Labs (all labs ordered are listed, but only abnormal results are displayed) Labs Reviewed  COMPREHENSIVE METABOLIC PANEL - Abnormal; Notable for the following components:      Result Value   Glucose, Bld 119 (*)    All other components within normal limits  URINALYSIS, ROUTINE W REFLEX MICROSCOPIC - Abnormal; Notable for the following components:   Specific Gravity, Urine 1.041 (*)    All other components within normal limits  LIPASE, BLOOD  CBC    EKG None  Radiology CT ABDOMEN PELVIS W CONTRAST  Result Date: 03/04/2020 CLINICAL DATA:  Lower abdominal pain for 2 days with nausea. Intermittent urinary incontinence for 2 months. EXAM: CT ABDOMEN AND PELVIS WITH CONTRAST TECHNIQUE: Multidetector CT imaging of the abdomen and pelvis was performed using the standard protocol following bolus administration of intravenous contrast. CONTRAST:  175mL OMNIPAQUE IOHEXOL 300 MG/ML  SOLN COMPARISON:  05/18/2003 CT abdomen/pelvis. 12/24/2004 CT pelvis. FINDINGS: Lower chest: No significant pulmonary nodules or acute consolidative airspace disease. Hepatobiliary: Normal liver size. No liver mass. Cholecystectomy. Bile ducts are within normal post cholecystectomy limits. 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 No pancreatic mass. Spleen: Normal size. No mass. Adrenals/Urinary Tract: Normal adrenals. Nonobstructing 3 mm lower right renal stone. Otherwise normal kidneys, with no hydronephrosis and no renal masses. Normal bladder. Stomach/Bowel: Normal non-distended stomach. Normal caliber small bowel with no small bowel wall thickening. Normal appendix. Moderate diffuse colonic diverticulosis, most prominent in the sigmoid colon. Segmental wall thickening in mid sigmoid colon with associated pericolonic fat stranding,  compatible with acute sigmoid diverticulitis (series 3/image 67). No pericolonic free air or abscess. Vascular/Lymphatic: Atherosclerotic nonaneurysmal abdominal aorta. Patent portal, splenic, hepatic and renal veins. No pathologically enlarged lymph nodes in the abdomen or pelvis. Reproductive: Status post hysterectomy, with no abnormal findings at the vaginal cuff. No adnexal mass. Other: No pneumoperitoneum, ascites or focal fluid collection. Musculoskeletal: No aggressive appearing focal osseous lesions. Mild thoracolumbar spondylosis. IMPRESSION: 1. Acute sigmoid diverticulitis. No free air or abscess. 2. Nonobstructing right nephrolithiasis. 3. Aortic Atherosclerosis (ICD10-I70.0). Electronically Signed   By: Ilona Sorrel M.D.   On: 03/04/2020 14:16    Procedures Procedures (including critical care time)  Medications Ordered in ED Medications  morphine 4 MG/ML injection 4 mg (has no administration in time range)  sodium chloride flush (NS) 0.9 % injection 3 mL (3 mLs Intravenous Given 03/04/20 1513)  sodium chloride 0.9 % bolus 1,000 mL (1,000 mLs Intravenous New Bag/Given 03/04/20 1347)  morphine 4 MG/ML injection 4 mg (  4 mg Intravenous Given 03/04/20 1322)  iohexol (OMNIPAQUE) 300 MG/ML solution 100 mL (100 mLs Intravenous Contrast Given 03/04/20 1350)  ketorolac (TORADOL) 30 MG/ML injection 30 mg (30 mg Intravenous Given 03/04/20 1511)    ED Course  I have reviewed the triage vital signs and the nursing notes.  Pertinent labs & imaging results that were available during my care of the patient were reviewed by me and considered in my medical decision making (see chart for details).    MDM Rules/Calculators/A&P                      I reviewed CT abdomen pelvis which demonstrates an acute sigmoid diverticulitis, but without any evidence of perforation or abscess.  Her lab work is entirely reassuring and her vital signs are stable, despite elevated blood pressure likely attributed to her  pain discomfort.  She is denying any nausea symptoms and is able to eat and drink without issue.  This is her second episode of diverticulitis and I encouraged her to follow-up with her primary care provider to discuss possible gastroenterology follow-up.  She recently performed Cologuard, but denies any colonoscopies.  I believe that it would be in her best interest.  We will prescribe metronidazole 3 times daily x10 days and ciprofloxacin twice daily x10 days.  Will prescribe short course of Vicodin to have at home in addition to over-the-counter medications for relief of her significant LLQ abdominal pain.  I also discussed with the patient her nonobstructing nephrolithiasis.  UPDATE:  On reassessment, patient reports that her pain has not improved despite IV morphine and IV Toradol here in the ED.  I offered a short course of narcotic analgesia for at home relief, but she is afraid that her symptoms will continue to be uncontrolled in setting of her diverticulitis.  Will consult with hospitalist for admission.  Spoke with Dr. Chipper Herb who will admit patient for observation and pain control in setting of her acute diverticulitis.   Final Clinical Impression(s) / ED Diagnoses Final diagnoses:  Acute diverticulitis    Rx / DC Orders ED Discharge Orders    None       Lorelee New, PA-C 03/04/20 1609    Margarita Grizzle, MD 03/04/20 2139

## 2020-03-04 NOTE — H&P (Signed)
History and Physical    SWAN FAIRFAX OFB:[id number] DOB: Oct 23, 1961 DOA: 03/04/2020  PCP: Lorenda Ishihara, MD   Patient coming from: Home  I have personally briefly reviewed patient's old medical records in [facility]  Chief Complaint: Abd pain  HPI: Diana Mendez is a 58 y.o. female with medical history significant of recurrent diverticulitis x4 in the past and last episode was about 5 years ago while living in [place], and HTN presented with new lower abd pain for 2 days, was episodic and diffused at the beginning then migrated to LLQ and persisted since last night, feeling nauseous no vomit, had one soft BM yesterday, no diarrhea, no fever or chills, no dysuria. ED Course: CT showing acute diverticulitis and right non-obstructing renal stone  Review of Systems: As per HPI otherwise 10 point review of systems negative.    Past Medical History:  Diagnosis Date  . Diabetes mellitus without complication (HCC)   . Hypertension     History reviewed. No pertinent surgical history.   reports that she has never smoked. She has never used smokeless tobacco. She reports that she does not drink alcohol or use drugs.  Allergies  Allergen Reactions  . Flexeril [Cyclobenzaprine] Anaphylaxis  . Lisinopril Diarrhea    No family history on file.   Prior to Admission medications   Medication Sig Start Date End Date Taking? Authorizing Provider  albuterol (VENTOLIN HFA) 108 (90 Base) MCG/ACT inhaler Inhale 1 puff into the lungs in the morning and at bedtime.   Yes [provider]  amLODipine (NORVASC) 5 MG tablet Take 1 tablet (5 mg total) by mouth daily. 07/23/18 03/04/20 Yes Curatolo, Adam, DO  escitalopram (LEXAPRO) 20 MG tablet Take 20 mg by mouth daily.   Yes [provider]  fluticasone furoate-vilanterol (BREO ELLIPTA) 200-25 MCG/INH AEPB Inhale 1 puff into the lungs daily.   Yes [provider]  gabapentin (NEURONTIN) 300 MG capsule Take 300  mg by mouth 3 (three) times daily.   Yes [provider]  metFORMIN (GLUCOPHAGE) 500 MG tablet Take 500 mg by mouth 2 (two) times daily with a meal.   Yes [provider]  rosuvastatin (CRESTOR) 10 MG tablet Take 10 mg by mouth at bedtime.   Yes [provider]  traZODone (DESYREL) 150 MG tablet Take 150 mg by mouth at bedtime.   Yes [provider]  valsartan-hydrochlorothiazide (DIOVAN-HCT) 160-12.5 MG tablet Take 1 tablet by mouth daily.   Yes [provider]    Physical Exam: Vitals:   03/04/20 1500 03/04/20 1600 03/04/20 1654 03/04/20 1700  BP:  (!) 153/94 (!) 159/79 (!) 148/78  Pulse:  82    Resp: 20     Temp:      TempSrc:      SpO2:  99% 100%     Constitutional: NAD, calm, comfortable Vitals:   03/04/20 1500 03/04/20 1600 03/04/20 1654 03/04/20 1700  BP:  (!) 153/94 (!) 159/79 (!) 148/78  Pulse:  82    Resp: 20     Temp:      TempSrc:      SpO2:  99% 100%    Eyes: PERRL, lids and conjunctivae normal ENMT: Mucous membranes are moist. Posterior pharynx clear of any exudate or lesions.Normal dentition.  Neck: normal, supple, no masses, no thyromegaly Respiratory: clear to auscultation bilaterally, no wheezing, no crackles. Normal respiratory effort. No accessory muscle use.  Cardiovascular: Regular rate and rhythm, no murmurs / rubs / gallops. No  extremity edema. 2+ pedal pulses. No carotid bruits.  Abdomen: Tender on deep palpation on LLQ, no rebound or guarding. No hepatosplenomegaly. Bowel sounds positive.  Musculoskeletal: no clubbing / cyanosis. No joint deformity upper and lower extremities. Good ROM, no contractures. Normal muscle tone.  Skin: no rashes, lesions, ulcers. No induration Neurologic: CN 2-12 grossly intact. Sensation intact, DTR normal. Strength 5/5 in all 4.  Psychiatric: Normal judgment and insight. Alert and oriented x 3. Normal mood.     Labs on Admission: I have personally reviewed following labs and  imaging studies  CBC: Recent Labs  Lab 03/04/20 1123  WBC 9.3  HGB 12.3  HCT 39.3  MCV 87.7  PLT 440   Basic Metabolic Panel: Recent Labs  Lab 03/04/20 1123  NA 141  K 4.1  CL 106  CO2 25  GLUCOSE 119*  BUN 13  CREATININE 0.81  CALCIUM 9.1   GFR: CrCl cannot be calculated (Unknown ideal weight.). Liver Function Tests: Recent Labs  Lab 03/04/20 1123  AST 18  ALT 15  ALKPHOS 92  BILITOT 0.9  PROT 7.4  ALBUMIN 3.9   Recent Labs  Lab 03/04/20 1123  LIPASE 19   No results for input(s): AMMONIA in the last 168 hours. Coagulation Profile: No results for input(s): INR, PROTIME in the last 168 hours. Cardiac Enzymes: No results for input(s): CKTOTAL, CKMB, CKMBINDEX, TROPONINI in the last 168 hours. BNP (last 3 results) No results for input(s): PROBNP in the last 8760 hours. HbA1C: No results for input(s): HGBA1C in the last 72 hours. CBG: No results for input(s): GLUCAP in the last 168 hours. Lipid Profile: No results for input(s): CHOL, HDL, LDLCALC, TRIG, CHOLHDL, LDLDIRECT in the last 72 hours. Thyroid Function Tests: No results for input(s): TSH, T4TOTAL, FREET4, T3FREE, THYROIDAB in the last 72 hours. Anemia Panel: No results for input(s): VITAMINB12, FOLATE, FERRITIN, TIBC, IRON, RETICCTPCT in the last 72 hours. Urine analysis:    Component Value Date/Time   COLORURINE YELLOW 03/04/2020 1422   APPEARANCEUR CLEAR 03/04/2020 1422   LABSPEC 1.041 (H) 03/04/2020 1422   PHURINE 5.0 03/04/2020 1422   GLUCOSEU NEGATIVE 03/04/2020 1422   HGBUR NEGATIVE 03/04/2020 1422   BILIRUBINUR NEGATIVE 03/04/2020 1422   KETONESUR NEGATIVE 03/04/2020 1422   PROTEINUR NEGATIVE 03/04/2020 1422   NITRITE NEGATIVE 03/04/2020 1422   LEUKOCYTESUR NEGATIVE 03/04/2020 1422    Radiological Exams on Admission: CT ABDOMEN PELVIS W CONTRAST  Result Date: 03/04/2020 CLINICAL DATA:  Lower abdominal pain for 2 days with nausea. Intermittent urinary incontinence for 2 months.  EXAM: CT ABDOMEN AND PELVIS WITH CONTRAST TECHNIQUE: Multidetector CT imaging of the abdomen and pelvis was performed using the standard protocol following bolus administration of intravenous contrast. CONTRAST:  161mL OMNIPAQUE IOHEXOL 300 MG/ML  SOLN COMPARISON:  05/18/2003 CT abdomen/pelvis. 12/24/2004 CT pelvis. FINDINGS: Lower chest: No significant pulmonary nodules or acute consolidative airspace disease. Hepatobiliary: Normal liver size. No liver mass. Cholecystectomy. Bile ducts are within normal post cholecystectomy limits. 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 No pancreatic mass. Spleen: Normal size. No mass. Adrenals/Urinary Tract: Normal adrenals. Nonobstructing 3 mm lower right renal stone. Otherwise normal kidneys, with no hydronephrosis and no renal masses. Normal bladder. Stomach/Bowel: Normal non-distended stomach. Normal caliber small bowel with no small bowel wall thickening. Normal appendix. Moderate diffuse colonic diverticulosis, most prominent in the sigmoid colon. Segmental wall thickening in mid sigmoid colon with associated pericolonic  fat stranding, compatible with acute sigmoid diverticulitis (series 3/image 67). No pericolonic free air or abscess. Vascular/Lymphatic: Atherosclerotic nonaneurysmal abdominal aorta. Patent portal, splenic, hepatic and renal veins. No pathologically enlarged lymph nodes in the abdomen or pelvis. Reproductive: Status post hysterectomy, with no abnormal findings at the vaginal cuff. No adnexal mass. Other: No pneumoperitoneum, ascites or focal fluid collection. Musculoskeletal: No aggressive appearing focal osseous lesions. Mild thoracolumbar spondylosis. IMPRESSION: 1. Acute sigmoid diverticulitis. No free air or abscess. 2. Nonobstructing right nephrolithiasis. 3. Aortic Atherosclerosis (ICD10-I70.0). Electronically Signed   By: Delbert Phenix M.D.   On: 03/04/2020  14:16    EKG: None  Assessment/Plan Active Problems:   Diverticulitis large intestine   Diverticulitis  Recurrent Diverticulitis CT showed no perf or abscess formation, current issue is pain control, ED tried to send pt home with PO ABX and PO pain meds, but pt unable to move because of pain. Will place pt on overnight hospital obs until her pain is more controlled Start ABX Since this is the 5th time pt had diverticulitis and pt was offered surgery last time she had it but she declined, this time pt willing to talk to surgeon about partial colectomy. I talked to on call surgeon Dr. Janee Morn, who is to see the patient tonight about elective surgery option Like can go home in next 24 hours.  HTN Home meds  Arthrosclerosis Accidental finding but compatible to CAD, will order lipid panel.   Elevated Glucose Check A1C    DVT prophylaxis: SCD Code Status: Full Family Communication: None at bedside Disposition Plan: Plan for discharge in 24 hours Consults called: Surgeon Dr. Janee Morn Admission status: Floor obs   Emeline General MD [facility] Pager [phone]    03/04/2020, 5:19 PM

## 2020-03-04 NOTE — Progress Notes (Signed)
Pharmacy Antibiotic Note  Diana Mendez is a 59 y.o. female admitted on 03/04/2020 with intra-abd infection.  Pharmacy has been consulted for Zosyn dosing.      Temp (24hrs), Avg:99.2 F (37.3 C), Min:98.5 F (36.9 C), Max:99.8 F (37.7 C)  Recent Labs  Lab 03/04/20 1123  WBC 9.3  CREATININE 0.81    CrCl cannot be calculated (Unknown ideal weight.).    Allergies  Allergen Reactions  . Flexeril [Cyclobenzaprine] Anaphylaxis  . Lisinopril Diarrhea    Antimicrobials this admission: 3/27 Zosyn >>   Dose adjustments this admission: N/a  Microbiology results: Pending   Plan:  - Zosyn 3.375g IV x 1 dose over 30 min followed by Zosyn 3.375g IV every 8 hours infused over 4 hours  - Monitor patients renal function and urine output  - De-escalate ABX when appropriate   Thank you for allowing pharmacy to be a part of this patient's care.  Joaquim Lai PharmD. BCPS 03/04/2020 4:38 PM

## 2020-03-04 NOTE — ED Notes (Signed)
Pt rings out on call button asking for morphine, explained to pt that IV access is necessary to provide this. Pt states she is willing to allow other RN to try since IV team not here yet.

## 2020-03-04 NOTE — ED Notes (Signed)
Unable to obtain IV access, pt unable to tolerate stick from 22G IV catheter, asks RN to remove prior to obtaining access. Requests IV team at this time.

## 2020-03-04 NOTE — ED Triage Notes (Signed)
C/o lower abd pain x 2 days with nausea.  Denies vomiting and diarrhea.  Reports small amount of urinary incontinence x 2 months.

## 2020-03-05 DIAGNOSIS — Z7951 Long term (current) use of inhaled steroids: Secondary | ICD-10-CM | POA: Diagnosis not present

## 2020-03-05 DIAGNOSIS — R112 Nausea with vomiting, unspecified: Secondary | ICD-10-CM | POA: Diagnosis present

## 2020-03-05 DIAGNOSIS — Z79899 Other long term (current) drug therapy: Secondary | ICD-10-CM | POA: Diagnosis not present

## 2020-03-05 DIAGNOSIS — Z7984 Long term (current) use of oral hypoglycemic drugs: Secondary | ICD-10-CM | POA: Diagnosis not present

## 2020-03-05 DIAGNOSIS — K5792 Diverticulitis of intestine, part unspecified, without perforation or abscess without bleeding: Secondary | ICD-10-CM | POA: Diagnosis not present

## 2020-03-05 DIAGNOSIS — Z90711 Acquired absence of uterus with remaining cervical stump: Secondary | ICD-10-CM | POA: Diagnosis not present

## 2020-03-05 DIAGNOSIS — I10 Essential (primary) hypertension: Secondary | ICD-10-CM | POA: Diagnosis present

## 2020-03-05 DIAGNOSIS — K5732 Diverticulitis of large intestine without perforation or abscess without bleeding: Secondary | ICD-10-CM | POA: Diagnosis present

## 2020-03-05 DIAGNOSIS — E785 Hyperlipidemia, unspecified: Secondary | ICD-10-CM | POA: Diagnosis present

## 2020-03-05 DIAGNOSIS — E1165 Type 2 diabetes mellitus with hyperglycemia: Secondary | ICD-10-CM | POA: Diagnosis present

## 2020-03-05 DIAGNOSIS — Z20822 Contact with and (suspected) exposure to covid-19: Secondary | ICD-10-CM | POA: Diagnosis present

## 2020-03-05 DIAGNOSIS — E119 Type 2 diabetes mellitus without complications: Secondary | ICD-10-CM

## 2020-03-05 LAB — LIPID PANEL
Cholesterol: 193 mg/dL (ref 0–200)
HDL: 48 mg/dL (ref >40)
LDL Cholesterol: 121 mg/dL — ABNORMAL HIGH (ref 0–99)
Total CHOL/HDL Ratio: 4 RATIO
Triglycerides: 118 mg/dL (ref <150)
VLDL: 24 mg/dL (ref 0–40)

## 2020-03-05 LAB — CBC
HCT: 33.2 % — ABNORMAL LOW (ref 36.0–46.0)
Hemoglobin: 10.7 g/dL — ABNORMAL LOW (ref 12.0–15.0)
MCH: 27.3 pg (ref 26.0–34.0)
MCHC: 32.2 g/dL (ref 30.0–36.0)
MCV: 84.7 fL (ref 80.0–100.0)
Platelets: 250 10*3/uL (ref 150–400)
RBC: 3.92 MIL/uL (ref 3.87–5.11)
RDW: 14.1 % (ref 11.5–15.5)
WBC: 8 10*3/uL (ref 4.0–10.5)
nRBC: 0 % (ref 0.0–0.2)

## 2020-03-05 LAB — BASIC METABOLIC PANEL
Anion gap: 9 (ref 5–15)
BUN: 9 mg/dL (ref 6–20)
CO2: 25 mmol/L (ref 22–32)
Calcium: 8.5 mg/dL — ABNORMAL LOW (ref 8.9–10.3)
Chloride: 105 mmol/L (ref 98–111)
Creatinine, Ser: 0.69 mg/dL (ref 0.44–1.00)
GFR calc Af Amer: >60 mL/min (ref >60)
GFR calc non Af Amer: >60 mL/min (ref >60)
Glucose, Bld: 106 mg/dL — ABNORMAL HIGH (ref 70–99)
Potassium: 3.6 mmol/L (ref 3.5–5.1)
Sodium: 139 mmol/L (ref 135–145)

## 2020-03-05 LAB — HEMOGLOBIN A1C
Hgb A1c MFr Bld: 6.8 % — ABNORMAL HIGH (ref 4.8–5.6)
Mean Plasma Glucose: 148.46 mg/dL

## 2020-03-05 LAB — HIV ANTIBODY (ROUTINE TESTING W REFLEX): HIV Screen 4th Generation wRfx: NONREACTIVE

## 2020-03-05 MED ORDER — GABAPENTIN 300 MG PO CAPS
300.0000 mg | ORAL_CAPSULE | Freq: Three times a day (TID) | ORAL | Status: DC
  Administered 2020-03-05 – 2020-03-08 (×9): 300 mg via ORAL
  Filled 2020-03-05 (×9): qty 1

## 2020-03-05 MED ORDER — ESCITALOPRAM OXALATE 20 MG PO TABS
20.0000 mg | ORAL_TABLET | Freq: Every day | ORAL | Status: DC
  Administered 2020-03-05 – 2020-03-08 (×4): 20 mg via ORAL
  Filled 2020-03-05 (×4): qty 1

## 2020-03-05 MED ORDER — LOSARTAN POTASSIUM 50 MG PO TABS
50.0000 mg | ORAL_TABLET | Freq: Every day | ORAL | Status: DC
  Administered 2020-03-05 – 2020-03-08 (×4): 50 mg via ORAL
  Filled 2020-03-05 (×4): qty 1

## 2020-03-05 MED ORDER — FLUTICASONE FUROATE-VILANTEROL 200-25 MCG/INH IN AEPB
1.0000 | INHALATION_SPRAY | Freq: Every day | RESPIRATORY_TRACT | Status: DC
  Filled 2020-03-05: qty 28

## 2020-03-05 MED ORDER — POLYETHYLENE GLYCOL 3350 17 G PO PACK
17.0000 g | PACK | Freq: Two times a day (BID) | ORAL | Status: DC
  Administered 2020-03-06 – 2020-03-07 (×2): 17 g via ORAL
  Filled 2020-03-05 (×6): qty 1

## 2020-03-05 MED ORDER — ROSUVASTATIN CALCIUM 5 MG PO TABS
10.0000 mg | ORAL_TABLET | Freq: Every day | ORAL | Status: DC
  Administered 2020-03-05 – 2020-03-07 (×3): 10 mg via ORAL
  Filled 2020-03-05 (×3): qty 2

## 2020-03-05 MED ORDER — FLUTICASONE FUROATE-VILANTEROL 200-25 MCG/INH IN AEPB
1.0000 | INHALATION_SPRAY | Freq: Every day | RESPIRATORY_TRACT | Status: DC
  Administered 2020-03-06 – 2020-03-07 (×2): 1 via RESPIRATORY_TRACT
  Filled 2020-03-05: qty 28

## 2020-03-05 NOTE — Progress Notes (Signed)
Progress Note    Diana Mendez  ZES:[id number] DOB: 11/09/1961  DOA: 03/04/2020 PCP: Diana Ishihara, MD    Brief Narrative:     Medical records reviewed and are as summarized below:  Diana Mendez is an 59 y.o. female with medical history significant of recurrent diverticulitis x4 in the past and last episode was about 5 years ago while living in [place], and HTN presented with new lower abd pain for 2 days, was episodic and diffused at the beginning then migrated to LLQ and persisted since last night, feeling nauseous no vomit, had one soft BM yesterday, no diarrhea, no fever or chills, no dysuria.  Assessment/Plan:   Active Problems:   Acute diverticulitis   Diverticulitis  Recurrent Diverticulitis CT showed no perf or abscess formation, current issue is pain control, ED tried to send pt home with PO ABX and PO pain meds, but pt unable to move because of pain. -IV antibiotics until tolerating p.o. better -Clears to be started today -Minimize IV pain medications as able -Bowel regimen   Type 2 diabetes without complications -Follows with a PCP at [facility] -Patient is supposed to be on Metformin twice daily -hemoglobin A1c 6.8    Family Communication/Anticipated D/C date and plan/Code Status   DVT prophylaxis: SCDs and encourage ambulation Code Status: Full Code.  Family Communication:  Disposition Plan: Possible discharge for home tomorrow afternoon. if able to tolerate p.o. antibiotics, diet can be advanced, and pain controlled   Medical Consultants:   General surgery   Subjective:   Patient asking for popsicles  Objective:    Vitals:   03/04/20 1800 03/04/20 2002 03/04/20 2049 03/05/20 0500  BP: (!) 154/94 (!) 160/91 (!) 143/89 (!) 155/82  Pulse: 85 85 86 86  Resp:  19    Temp:  98.6 F (37 C) 98.9 F (37.2 C) 98.6 F (37 C)  TempSrc:  Oral Oral Oral  SpO2: 99% 100% 100% 100%  Weight:   71.7 kg   Height:   5\' 8"  (1.727 m)      Intake/Output Summary (Last 24 hours) at 03/05/2020 1420 Last data filed at 03/05/2020 03/07/2020 Gross per 24 hour  Intake 2255.01 ml  Output --  Net 2255.01 ml   Filed Weights   03/04/20 2049  Weight: 71.7 kg    Exam: In bed, no acute distress Regular rate and rhythm No increased work of breathing Positive bowel sounds, tender in the left lower quadrant  Data Reviewed:   I have personally reviewed following labs and imaging studies:  Labs: Labs show the following:   Basic Metabolic Panel: Recent Labs  Lab 03/04/20 1123 03/05/20 0346  NA 141 139  K 4.1 3.6  CL 106 105  CO2 25 25  GLUCOSE 119* 106*  BUN 13 9  CREATININE 0.81 0.69  CALCIUM 9.1 8.5*   GFR Estimated Creatinine Clearance: 75.4 mL/min (by C-G formula based on SCr of 0.69 mg/dL). Liver Function Tests: Recent Labs  Lab 03/04/20 1123  AST 18  ALT 15  ALKPHOS 92  BILITOT 0.9  PROT 7.4  ALBUMIN 3.9   Recent Labs  Lab 03/04/20 1123  LIPASE 19   No results for input(s): AMMONIA in the last 168 hours. Coagulation profile No results for input(s): INR, PROTIME in the last 168 hours.  CBC: Recent Labs  Lab 03/04/20 1123 03/05/20 0346  WBC 9.3 8.0  HGB 12.3 10.7*  HCT 39.3 33.2*  MCV 87.7 84.7  PLT 298 250  Cardiac Enzymes: No results for input(s): CKTOTAL, CKMB, CKMBINDEX, TROPONINI in the last 168 hours. BNP (last 3 results) No results for input(s): PROBNP in the last 8760 hours. CBG: No results for input(s): GLUCAP in the last 168 hours. D-Dimer: No results for input(s): DDIMER in the last 72 hours. Hgb A1c: Recent Labs    03/05/20 0346  HGBA1C 6.8*   Lipid Profile: Recent Labs    03/05/20 0346  CHOL 193  HDL 48  LDLCALC 121*  TRIG 118  CHOLHDL 4.0   Thyroid function studies: No results for input(s): TSH, T4TOTAL, T3FREE, THYROIDAB in the last 72 hours.  Invalid input(s): FREET3 Anemia work up: No results for input(s): VITAMINB12, FOLATE, FERRITIN, TIBC, IRON,  RETICCTPCT in the last 72 hours. Sepsis Labs: Recent Labs  Lab 03/04/20 1123 03/05/20 0346  WBC 9.3 8.0    Microbiology Recent Results (from the past 240 hour(s))  SARS CORONAVIRUS 2 (TAT 6-24 HRS) Nasopharyngeal Nasopharyngeal Swab     Status: None   Collection Time: 03/04/20  5:12 PM   Specimen: Nasopharyngeal Swab  Result Value Ref Range Status   SARS Coronavirus 2 NEGATIVE NEGATIVE Final    Comment: (NOTE) SARS-CoV-2 target nucleic acids are NOT DETECTED. The SARS-CoV-2 RNA is generally detectable in upper and lower respiratory specimens during the acute phase of infection. Negative results do not preclude SARS-CoV-2 infection, do not rule out co-infections with other pathogens, and should not be used as the sole basis for treatment or other patient management decisions. Negative results must be combined with clinical observations, patient history, and epidemiological information. The expected result is Negative. Fact Sheet for Patients: HairSlick.no Fact Sheet for Healthcare Providers: quierodirigir.com This test is not yet approved or cleared by the [place] FDA and  has been authorized for detection and/or diagnosis of SARS-CoV-2 by FDA under an Emergency Use Authorization (EUA). This EUA will remain  in effect (meaning this test can be used) for the duration of the COVID-19 declaration under Section 56 4(b)(1) of the Act, 21 U.S.C. section 360bbb-3(b)(1), unless the authorization is terminated or revoked sooner. Performed at [facility], [address], [place], [place] [address]     Procedures and diagnostic studies:  CT ABDOMEN PELVIS W CONTRAST  Result Date: 03/04/2020 CLINICAL DATA:  Lower abdominal pain for 2 days with nausea. Intermittent urinary incontinence for 2 months. EXAM: CT ABDOMEN AND PELVIS WITH CONTRAST TECHNIQUE: Multidetector CT imaging of the abdomen and pelvis was performed  using the standard protocol following bolus administration of intravenous contrast. CONTRAST:  OMNIPAQUE IOHEXOL 300 MG/ML  SOLN COMPARISON:  05/18/2003 CT abdomen/pelvis. 12/24/2004 CT pelvis. FINDINGS: Lower chest: No significant pulmonary nodules or acute consolidative airspace disease. Hepatobiliary: Normal liver size. No liver mass. Cholecystectomy. Bile ducts are within normal post cholecystectomy limits. 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 No pancreatic mass. Spleen: Normal size. No mass. Adrenals/Urinary Tract: Normal adrenals. Nonobstructing 3 mm lower right renal stone. Otherwise normal kidneys, with no hydronephrosis and no renal masses. Normal bladder. Stomach/Bowel: Normal non-distended stomach. Normal caliber small bowel with no small bowel wall thickening. Normal appendix. Moderate diffuse colonic diverticulosis, most prominent in the sigmoid colon. Segmental wall thickening in mid sigmoid colon with associated pericolonic fat stranding, compatible with acute sigmoid diverticulitis (series 3/image 67). No pericolonic free air or abscess. Vascular/Lymphatic: Atherosclerotic nonaneurysmal abdominal aorta. Patent portal, splenic, hepatic and renal veins. No pathologically enlarged  lymph nodes in the abdomen or pelvis. Reproductive: Status post hysterectomy, with no abnormal findings at the vaginal cuff. No adnexal mass. Other: No pneumoperitoneum, ascites or focal fluid collection. Musculoskeletal: No aggressive appearing focal osseous lesions. Mild thoracolumbar spondylosis. IMPRESSION: 1. Acute sigmoid diverticulitis. No free air or abscess. 2. Nonobstructing right nephrolithiasis. 3. Aortic Atherosclerosis (ICD10-I70.0). Electronically Signed   By: Ilona Sorrel M.D.   On: 03/04/2020 14:16    Medications:   . amLODipine  5 mg Oral Daily  . polyethylene glycol  17 g Oral BID   Continuous  Infusions: . sodium chloride 125 mL/hr at 03/05/20 0637  . piperacillin-tazobactam (ZOSYN)  IV       LOS: 0 days   Geradine Girt  [facility]   How to contact the [facility] Attending or Consulting provider [place] or covering provider during after hours [place], for this patient?  1. Check the care team in [facility] and look for a) attending/consulting [facility] provider listed and b) the [facility] team listed 2. Log into [email or web address] and use [place]'s universal password to access. If you do not have the password, please contact the hospital operator. 3. Locate the [facility] provider you are looking for under [facility] and page to a number that you can be directly reached. 4. If you still have difficulty reaching the provider, please page the [facility] (Director on Call) for the Hospitalists listed on amion for assistance.  03/05/2020, 2:20 PM

## 2020-03-05 NOTE — Progress Notes (Signed)
Subjective/Chief Complaint: Still with pain but it is a little better. Hungry.    Objective: Vital signs in last 24 hours: Temp:  [98.5 F (36.9 C)-99.8 F (37.7 C)] 98.6 F (37 C) (03/28 0500) Pulse Rate:  [82-104] 86 (03/28 0500) Resp:  [14-20] 19 (03/27 2002) BP: (143-198)/(78-115) 155/82 (03/28 0500) SpO2:  [99 %-100 %] 100 % (03/28 0500) Weight:  [71.7 kg] 71.7 kg (03/27 2049)    Intake/Output from previous day: 03/27 0701 - 03/28 0700 In: 2255 [P.O.:275; I.V.:980; IV Piggyback:1000] Out: -  Intake/Output this shift: No intake/output data recorded.  General appearance: alert and cooperative Resp: clear to auscultation bilaterally Cardio: regular rate and rhythm Abd soft, nondistended, tender with voluntary guarding LLQ  Lab Results:  Recent Labs    03/04/20 1123 03/05/20 0346  WBC 9.3 8.0  HGB 12.3 10.7*  HCT 39.3 33.2*  PLT 298 250   BMET Recent Labs    03/04/20 1123 03/05/20 0346  NA 141 139  K 4.1 3.6  CL 106 105  CO2 25 25  GLUCOSE 119* 106*  BUN 13 9  CREATININE 0.81 0.69  CALCIUM 9.1 8.5*   PT/INR No results for input(s): LABPROT, INR in the last 72 hours. ABG No results for input(s): PHART, HCO3 in the last 72 hours.  Invalid input(s): PCO2, PO2  Studies/Results: CT ABDOMEN PELVIS W CONTRAST  Result Date: 03/04/2020 CLINICAL DATA:  Lower abdominal pain for 2 days with nausea. Intermittent urinary incontinence for 2 months. EXAM: CT ABDOMEN AND PELVIS WITH CONTRAST TECHNIQUE: Multidetector CT imaging of the abdomen and pelvis was performed using the standard protocol following bolus administration of intravenous contrast. CONTRAST:  OMNIPAQUE IOHEXOL 300 MG/ML  SOLN COMPARISON:  05/18/2003 CT abdomen/pelvis. 12/24/2004 CT pelvis. FINDINGS: Lower chest: No significant pulmonary nodules or acute consolidative airspace disease. Hepatobiliary: Normal liver size. No liver mass. Cholecystectomy. Bile ducts are within normal post  cholecystectomy limits. 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 No pancreatic mass. Spleen: Normal size. No mass. Adrenals/Urinary Tract: Normal adrenals. Nonobstructing 3 mm lower right renal stone. Otherwise normal kidneys, with no hydronephrosis and no renal masses. Normal bladder. Stomach/Bowel: Normal non-distended stomach. Normal caliber small bowel with no small bowel wall thickening. Normal appendix. Moderate diffuse colonic diverticulosis, most prominent in the sigmoid colon. Segmental wall thickening in mid sigmoid colon with associated pericolonic fat stranding, compatible with acute sigmoid diverticulitis (series 3/image 67). No pericolonic free air or abscess. Vascular/Lymphatic: Atherosclerotic nonaneurysmal abdominal aorta. Patent portal, splenic, hepatic and renal veins. No pathologically enlarged lymph nodes in the abdomen or pelvis. Reproductive: Status post hysterectomy, with no abnormal findings at the vaginal cuff. No adnexal mass. Other: No pneumoperitoneum, ascites or focal fluid collection. Musculoskeletal: No aggressive appearing focal osseous lesions. Mild thoracolumbar spondylosis. IMPRESSION: 1. Acute sigmoid diverticulitis. No free air or abscess. 2. Nonobstructing right nephrolithiasis. 3. Aortic Atherosclerosis (ICD10-I70.0). Electronically Signed   By: Delbert Phenix M.D.   On: 03/04/2020 14:16    Anti-infectives: Anti-infectives (From admission, onward)   Start     Dose/Rate Route Frequency Ordered Stop   03/05/20 2230  piperacillin-tazobactam (ZOSYN) IVPB 3.375 g     3.375 g 12.5 mL/hr over 240 Minutes Intravenous Every 8 hours 03/04/20 1637     03/04/20 1645  piperacillin-tazobactam (ZOSYN) IVPB 3.375 g     3.375 g 100 mL/hr over 30 Minutes Intravenous  Once 03/04/20 1637 03/04/20 1836  03/04/20 1630  piperacillin-tazobactam (ZOSYN) IVPB 3.375 g  Status:  Discontinued     3.375  g 100 mL/hr over 30 Minutes Intravenous  Once 03/04/20 1628 03/04/20 1637      Assessment/Plan: Uncomplicated diverticulitis Second attack  Clears today, add miralax  LOS: 0 days    Clovis Riley 03/05/2020

## 2020-03-05 NOTE — Plan of Care (Signed)
Patient admitted to [facility] at around 8pm from ED. C/o of LUQ pain, SBP in the 180, other VSS. Oriented to room and staff, call bell within reach , pain med adm. Will keep monitoring.   Problem: Education: Goal: Knowledge of General Education information will improve Description: Including pain rating scale, medication(s)/side effects and non-pharmacologic comfort measures Outcome: Progressing   Problem: Health Behavior/Discharge Planning: Goal: Ability to manage health-related needs will improve Outcome: Progressing   Problem: Clinical Measurements: Goal: Ability to maintain clinical measurements within normal limits will improve Outcome: Progressing Goal: Will remain free from infection Outcome: Progressing Goal: Diagnostic test results will improve Outcome: Progressing Goal: Respiratory complications will improve Outcome: Progressing Goal: Cardiovascular complication will be avoided Outcome: Progressing   Problem: Activity: Goal: Risk for activity intolerance will decrease Outcome: Progressing   Problem: Nutrition: Goal: Adequate nutrition will be maintained Outcome: Progressing   Problem: Coping: Goal: Level of anxiety will decrease Outcome: Progressing   Problem: Elimination: Goal: Will not experience complications related to bowel motility Outcome: Progressing Goal: Will not experience complications related to urinary retention Outcome: Progressing   Problem: Pain Managment: Goal: General experience of comfort will improve Outcome: Progressing   Problem: Safety: Goal: Ability to remain free from injury will improve Outcome: Progressing   Problem: Skin Integrity: Goal: Risk for impaired skin integrity will decrease Outcome: Progressing

## 2020-03-06 LAB — CBC
HCT: 32.6 % — ABNORMAL LOW (ref 36.0–46.0)
Hemoglobin: 10.5 g/dL — ABNORMAL LOW (ref 12.0–15.0)
MCH: 27.3 pg (ref 26.0–34.0)
MCHC: 32.2 g/dL (ref 30.0–36.0)
MCV: 84.7 fL (ref 80.0–100.0)
Platelets: 246 10*3/uL (ref 150–400)
RBC: 3.85 MIL/uL — ABNORMAL LOW (ref 3.87–5.11)
RDW: 13.9 % (ref 11.5–15.5)
WBC: 5.9 10*3/uL (ref 4.0–10.5)
nRBC: 0 % (ref 0.0–0.2)

## 2020-03-06 LAB — BASIC METABOLIC PANEL
Anion gap: 7 (ref 5–15)
BUN: 6 mg/dL (ref 6–20)
CO2: 26 mmol/L (ref 22–32)
Calcium: 8.3 mg/dL — ABNORMAL LOW (ref 8.9–10.3)
Chloride: 107 mmol/L (ref 98–111)
Creatinine, Ser: 0.73 mg/dL (ref 0.44–1.00)
GFR calc Af Amer: >60 mL/min (ref >60)
GFR calc non Af Amer: >60 mL/min (ref >60)
Glucose, Bld: 107 mg/dL — ABNORMAL HIGH (ref 70–99)
Potassium: 3.6 mmol/L (ref 3.5–5.1)
Sodium: 140 mmol/L (ref 135–145)

## 2020-03-06 MED ORDER — IBUPROFEN 400 MG PO TABS
400.0000 mg | ORAL_TABLET | Freq: Four times a day (QID) | ORAL | Status: DC | PRN
  Administered 2020-03-07 (×2): 400 mg via ORAL
  Filled 2020-03-06 (×2): qty 1

## 2020-03-06 MED ORDER — METRONIDAZOLE IN NACL 5-0.79 MG/ML-% IV SOLN: 500.0000 mg | Freq: Three times a day (TID) | INTRAVENOUS | Status: DC

## 2020-03-06 MED ORDER — METOPROLOL TARTRATE 5 MG/5ML IV SOLN
5.0000 mg | Freq: Four times a day (QID) | INTRAVENOUS | Status: DC | PRN
  Administered 2020-03-06: 15:00:00 5 mg via INTRAVENOUS
  Filled 2020-03-06: qty 5

## 2020-03-06 MED ORDER — ACETAMINOPHEN 500 MG PO TABS
1000.0000 mg | ORAL_TABLET | Freq: Three times a day (TID) | ORAL | Status: DC
  Administered 2020-03-06 – 2020-03-08 (×6): 1000 mg via ORAL
  Filled 2020-03-06 (×6): qty 2

## 2020-03-06 MED ORDER — IBUPROFEN 600 MG PO TABS: 600.0000 mg | ORAL_TABLET | Freq: Four times a day (QID) | ORAL | Status: DC | PRN

## 2020-03-06 MED ORDER — SODIUM CHLORIDE 0.9 % IV SOLN
3.0000 g | Freq: Four times a day (QID) | INTRAVENOUS | Status: DC
  Administered 2020-03-06 – 2020-03-08 (×8): 3 g via INTRAVENOUS
  Filled 2020-03-06 (×7): qty 3
  Filled 2020-03-06 (×2): qty 8
  Filled 2020-03-06: qty 3

## 2020-03-06 MED ORDER — OXYCODONE HCL 5 MG PO TABS: 5.0000 mg | ORAL_TABLET | Freq: Four times a day (QID) | ORAL | Status: DC | PRN

## 2020-03-06 MED ORDER — SODIUM CHLORIDE 0.9 % IV SOLN
2.0000 g | INTRAVENOUS | Status: DC
  Filled 2020-03-06: qty 20

## 2020-03-06 MED ORDER — HYDROCHLOROTHIAZIDE 25 MG PO TABS
25.0000 mg | ORAL_TABLET | Freq: Every day | ORAL | Status: DC
  Administered 2020-03-06 – 2020-03-08 (×3): 25 mg via ORAL
  Filled 2020-03-06 (×3): qty 1

## 2020-03-06 NOTE — Plan of Care (Signed)
  Problem: Education: Goal: Knowledge of General Education information will improve Description: Including pain rating scale, medication(s)/side effects and non-pharmacologic comfort measures Outcome: Progressing   Problem: Health Behavior/Discharge Planning: Goal: Ability to manage health-related needs will improve Outcome: Progressing   Problem: Clinical Measurements: Goal: Ability to maintain clinical measurements within normal limits will improve Outcome: Progressing   Problem: Activity: Goal: Risk for activity intolerance will decrease Outcome: Progressing   Problem: Elimination: Goal: Will not experience complications related to bowel motility Outcome: Progressing Goal: Will not experience complications related to urinary retention Outcome: Progressing   Problem: Pain Managment: Goal: General experience of comfort will improve Outcome: Progressing   Problem: Safety: Goal: Ability to remain free from injury will improve Outcome: Progressing   Problem: Skin Integrity: Goal: Risk for impaired skin integrity will decrease Outcome: Progressing   

## 2020-03-06 NOTE — Progress Notes (Signed)
[facility] Progress Note     Subjective: CC-  Feeling much better than date of admission, but continues to feel bloated and have LLQ abdominal pain. Tolerating clears without worsening pain. Feels hungry. BM yesterday.  Objective: Vital signs in last 24 hours: Temp:  [98.8 F (37.1 C)-100.1 F (37.8 C)] 98.8 F (37.1 C) (03/29 0502) Pulse Rate:  [72-80] 72 (03/29 0502) Resp:  [18] 18 (03/29 0502) BP: (171-180)/(78-99) 180/78 (03/29 0502) SpO2:  [99 %-100 %] 100 % (03/29 0502) Weight:  [60.3 kg] 60.3 kg (03/29 0502) Last BM Date: 03/05/20  Intake/Output from previous day: 03/28 0701 - 03/29 0700 In: 2764.2 [P.O.:1400; I.V.:1314.2; IV Piggyback:50] Out: -  Intake/Output this shift: No intake/output data recorded.  PE: Gen:  Alert, NAD, pleasant HEENT: EOM's intact, pupils equal and round Card:  RRR Pulm:  CTAB, no W/R/R, rate and effort normal Abd: Soft, mild distension, +BS, no HSM, TTP LLQ with some voluntary guarding, no rebound or peritonitis Skin: no rashes noted, warm and dry  Lab Results:  Recent Labs    03/05/20 0346 03/06/20 0230  WBC 8.0 5.9  HGB 10.7* 10.5*  HCT 33.2* 32.6*  PLT 250 246   BMET Recent Labs    03/05/20 0346 03/06/20 0230  NA 139 140  K 3.6 3.6  CL 105 107  CO2 25 26  GLUCOSE 106* 107*  BUN 9 6  CREATININE 0.69 0.73  CALCIUM 8.5* 8.3*   PT/INR No results for input(s): LABPROT, INR in the last 72 hours. CMP     Component Value Date/Time   NA 140 03/06/2020 0230   K 3.6 03/06/2020 0230   CL 107 03/06/2020 0230   CO2 26 03/06/2020 0230   GLUCOSE 107 (H) 03/06/2020 0230   BUN 6 03/06/2020 0230   CREATININE 0.73 03/06/2020 0230   CALCIUM 8.3 (L) 03/06/2020 0230   PROT 7.4 03/04/2020 1123   ALBUMIN 3.9 03/04/2020 1123   AST 18 03/04/2020 1123   ALT 15 03/04/2020 1123   ALKPHOS 92 03/04/2020 1123   BILITOT 0.9 03/04/2020 1123   GFRNONAA >60 03/06/2020 0230   GFRAA >60 03/06/2020 0230   Lipase      Component Value Date/Time   LIPASE 19 03/04/2020 1123       Studies/Results: CT ABDOMEN PELVIS W CONTRAST  Result Date: 03/04/2020 CLINICAL DATA:  Lower abdominal pain for 2 days with nausea. Intermittent urinary incontinence for 2 months. EXAM: CT ABDOMEN AND PELVIS WITH CONTRAST TECHNIQUE: Multidetector CT imaging of the abdomen and pelvis was performed using the standard protocol following bolus administration of intravenous contrast. CONTRAST:  OMNIPAQUE IOHEXOL 300 MG/ML  SOLN COMPARISON:  05/18/2003 CT abdomen/pelvis. 12/24/2004 CT pelvis. FINDINGS: Lower chest: No significant pulmonary nodules or acute consolidative airspace disease. Hepatobiliary: Normal liver size. No liver mass. Cholecystectomy. Bile ducts are within normal post cholecystectomy limits. 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 No pancreatic mass. Spleen: Normal size. No mass. Adrenals/Urinary Tract: Normal adrenals. Nonobstructing 3 mm lower right renal stone. Otherwise normal kidneys, with no hydronephrosis and no renal masses. Normal bladder. Stomach/Bowel: Normal non-distended stomach. Normal caliber small bowel with no small bowel wall thickening. Normal appendix. Moderate diffuse colonic diverticulosis, most prominent in the sigmoid colon. Segmental wall thickening in mid sigmoid colon with associated pericolonic fat stranding, compatible with acute sigmoid diverticulitis (series 3/image 67). No pericolonic free air or abscess. Vascular/Lymphatic: Atherosclerotic nonaneurysmal abdominal  aorta. Patent portal, splenic, hepatic and renal veins. No pathologically enlarged lymph nodes in the abdomen or pelvis. Reproductive: Status post hysterectomy, with no abnormal findings at the vaginal cuff. No adnexal mass. Other: No pneumoperitoneum, ascites or focal fluid collection. Musculoskeletal: No aggressive appearing focal osseous  lesions. Mild thoracolumbar spondylosis. IMPRESSION: 1. Acute sigmoid diverticulitis. No free air or abscess. 2. Nonobstructing right nephrolithiasis. 3. Aortic Atherosclerosis (ICD10-I70.0). Electronically Signed   By: Ilona Sorrel M.D.   On: 03/04/2020 14:16    Anti-infectives: Anti-infectives (From admission, onward)   Start     Dose/Rate Route Frequency Ordered Stop   03/05/20 2230  piperacillin-tazobactam (ZOSYN) IVPB 3.375 g     3.375 g 12.5 mL/hr over 240 Minutes Intravenous Every 8 hours 03/04/20 1637     03/04/20 1645  piperacillin-tazobactam (ZOSYN) IVPB 3.375 g     3.375 g 100 mL/hr over 30 Minutes Intravenous  Once 03/04/20 1637 03/04/20 1836   03/04/20 1630  piperacillin-tazobactam (ZOSYN) IVPB 3.375 g  Status:  Discontinued     3.375 g 100 mL/hr over 30 Minutes Intravenous  Once 03/04/20 1628 03/04/20 1637       Assessment/Plan DM HTN HLD  Acute sigmoid diverticulitis - 2nd bout, last colonoscopy about 7 years ago in [place] - CT scan 3/27 showed acute sigmoid diverticulitis with no free air or abscess  ID - zosyn 3/27>> FEN - IVF, FLD VTE - SCDs, ok for chemical DVT prophylaxis from surgical standpoint Foley - none Follow up - TBD  Plan: WBC WNL and pain improving although still tender LLQ. [place] for FLD but take diet slowly. Mobilize. Repeat labs in AM.   LOS: 1 day    Wellington [place], [facility] 03/06/2020, 8:46 AM Please see Amion for pager number during day hours 7:00am-4:30pm

## 2020-03-06 NOTE — Progress Notes (Signed)
Pharmacy Antibiotic Note  Diana Mendez is a 59 y.o. female admitted on 03/04/2020 with intra-abdominal infection.  Pharmacy has been consulted for ampicillin-sulbactam (Unasyn) dosing.  Plan: Unasyn 3g IV q6h Monitor clinical picture and renal function F/U abx deescalation / LOT  Height: 5\' 8"  (172.7 cm) Weight: 132 lb 15 oz (60.3 kg) IBW/kg (Calculated) : 63.9  Temp (24hrs), Avg:99.2 F (37.3 C), Min:98.8 F (37.1 C), Max:100.1 F (37.8 C)  Recent Labs  Lab 03/04/20 1123 03/05/20 0346 03/06/20 0230  WBC 9.3 8.0 5.9  CREATININE 0.81 0.69 0.73    Estimated Creatinine Clearance: 71.2 mL/min (by C-G formula based on SCr of 0.73 mg/dL).    Allergies  Allergen Reactions  . Flexeril [Cyclobenzaprine] Anaphylaxis  . Lisinopril Diarrhea    Antimicrobials this admission: 03/05/20 Zosyn  >>  3/29 03/06/20 Unasyn >>   Dose adjustments this admission: None  Microbiology results: 03/04/20 Covid: negative  Thank you for allowing pharmacy to be a part of this patient's care.  03/06/20 03/06/2020 12:45 PM

## 2020-03-06 NOTE — Progress Notes (Signed)
PROGRESS NOTE    [name]  UKG:[id number] DOB: 1961/11/20 DOA: 03/04/2020 PCP: Leeroy Cha, MD      Brief Narrative:  Diana Mendez is a 59 y.o. F with hx HTN and previous diverticulitis who presented with abdominal pain.  CT showed sigmoid diverticulitis.  Started on antibiotics and admitted to hospitalist service.      Assessment & Plan:  Diverticulitis -Continue antibiotics, narrow to Unasyn -Continue NSAIDs -Advance diet as tolerated  Hypertension BP elevated -Continue amlodipine, losartan -Resume HCTZ  Hyperlipidemia -Continue Crestor  Bronchospastic disease -Continue inhalers          Disposition: The patient was admitted with diverticulitis.  The patient continues to have severe pain, not controlled with oral agents.  We will continue with Toradol, IV antibiotics.   I will discharge when her pain is improved, she has advance to solid diet, likely tomorrow.        MDM: The below labs and imaging reports were reviewed and summarized above.  Medication management as above.         Subjective: Patient still has severe pain, difficult to walk to the bathroom.  She has no confusion, fever, vomiting, diarrhea.  No hematochezia.  Objective: Vitals:   03/05/20 0500 03/05/20 1532 03/05/20 2156 03/06/20 0502  BP: (!) 155/82 (!) 173/99 (!) 171/95 (!) 180/78  Pulse: 86 80 78 72  Resp:  18 18 18   Temp: 98.6 F (37 C) 98.8 F (37.1 C) 100.1 F (37.8 C) 98.8 F (37.1 C)  TempSrc: Oral Oral Oral Oral  SpO2: 100% 99% 100% 100%  Weight:    60.3 kg  Height:        Intake/Output Summary (Last 24 hours) at 03/06/2020 1203 Last data filed at 03/06/2020 7628 Gross per 24 hour  Intake 2764.23 ml  Output --  Net 2764.23 ml   Filed Weights   03/04/20 2049 03/06/20 0502  Weight: 71.7 kg 60.3 kg    Examination: General appearance:  adult female, alert and in moderate distress from pain.   HEENT: Anicteric, conjunctiva pink,  lids and lashes normal. No nasal deformity, discharge, epistaxis.  Lips moist.   Skin: Warm and dry.  no jaundice.  No suspicious rashes or lesions. Cardiac: RRR, nl S1-S2, no murmurs appreciated.  Capillary refill is brisk.  JVP normal.  No LE edema.  Radial  pulses 2+ and symmetric. Respiratory: Normal respiratory rate and rhythm.  CTAB without rales or wheezes. Abdomen: Abdomen soft.  Severe left lower quadrant TTP with guarding, no rebound. No ascites, distension, hepatosplenomegaly.   MSK: No deformities or effusions. Neuro: Awake and alert.  EOMI, moves all extremities. Speech fluent.    Psych: Sensorium intact and responding to questions, attention normal. Affect pained.  Judgment and insight appear normal.    Data Reviewed: I have personally reviewed following labs and imaging studies:  CBC: Recent Labs  Lab 03/04/20 1123 03/05/20 0346 03/06/20 0230  WBC 9.3 8.0 5.9  HGB 12.3 10.7* 10.5*  HCT 39.3 33.2* 32.6*  MCV 87.7 84.7 84.7  PLT 298 250 315   Basic Metabolic Panel: Recent Labs  Lab 03/04/20 1123 03/05/20 0346 03/06/20 0230  NA 141 139 140  K 4.1 3.6 3.6  CL 106 105 107  CO2 25 25 26   GLUCOSE 119* 106* 107*  BUN 13 9 6   CREATININE 0.81 0.69 0.73  CALCIUM 9.1 8.5* 8.3*   GFR: Estimated Creatinine Clearance: 71.2 mL/min (by C-G formula based on SCr of 0.73 mg/dL). Liver  Function Tests: Recent Labs  Lab 03/04/20 1123  AST 18  ALT 15  ALKPHOS 92  BILITOT 0.9  PROT 7.4  ALBUMIN 3.9   Recent Labs  Lab 03/04/20 1123  LIPASE 19   No results for input(s): AMMONIA in the last 168 hours. Coagulation Profile: No results for input(s): INR, PROTIME in the last 168 hours. Cardiac Enzymes: No results for input(s): CKTOTAL, CKMB, CKMBINDEX, TROPONINI in the last 168 hours. BNP (last 3 results) No results for input(s): PROBNP in the last 8760 hours. HbA1C: Recent Labs    03/05/20 0346  HGBA1C 6.8*   CBG: No results for input(s): GLUCAP in the last 168  hours. Lipid Profile: Recent Labs    03/05/20 0346  CHOL 193  HDL 48  LDLCALC 121*  TRIG 118  CHOLHDL 4.0   Thyroid Function Tests: No results for input(s): TSH, T4TOTAL, FREET4, T3FREE, THYROIDAB in the last 72 hours. Anemia Panel: No results for input(s): VITAMINB12, FOLATE, FERRITIN, TIBC, IRON, RETICCTPCT in the last 72 hours. Urine analysis:    Component Value Date/Time   COLORURINE YELLOW 03/04/2020 1422   APPEARANCEUR CLEAR 03/04/2020 1422   LABSPEC 1.041 (H) 03/04/2020 1422   PHURINE 5.0 03/04/2020 1422   GLUCOSEU NEGATIVE 03/04/2020 1422   HGBUR NEGATIVE 03/04/2020 1422   BILIRUBINUR NEGATIVE 03/04/2020 1422   KETONESUR NEGATIVE 03/04/2020 1422   PROTEINUR NEGATIVE 03/04/2020 1422   NITRITE NEGATIVE 03/04/2020 1422   LEUKOCYTESUR NEGATIVE 03/04/2020 1422   Sepsis Labs: @LABRCNTIP (procalcitonin:4,lacticacidven:4)  ) Recent Results (from the past 240 hour(s))  SARS CORONAVIRUS 2 (TAT 6-24 HRS) Nasopharyngeal Nasopharyngeal Swab     Status: None   Collection Time: 03/04/20  5:12 PM   Specimen: Nasopharyngeal Swab  Result Value Ref Range Status   SARS Coronavirus 2 NEGATIVE NEGATIVE Final    Comment: (NOTE) SARS-CoV-2 target nucleic acids are NOT DETECTED. The SARS-CoV-2 RNA is generally detectable in upper and lower respiratory specimens during the acute phase of infection. Negative results do not preclude SARS-CoV-2 infection, do not rule out co-infections with other pathogens, and should not be used as the sole basis for treatment or other patient management decisions. Negative results must be combined with clinical observations, patient history, and epidemiological information. The expected result is Negative. Fact Sheet for Patients: 03/06/20 Fact Sheet for Healthcare Providers: HairSlick.no This test is not yet approved or cleared by the quierodirigir.com FDA and  has been authorized for  detection and/or diagnosis of SARS-CoV-2 by FDA under an Emergency Use Authorization (EUA). This EUA will remain  in effect (meaning this test can be used) for the duration of the COVID-19 declaration under Section 56 4(b)(1) of the Act, 21 U.S.C. section 360bbb-3(b)(1), unless the authorization is terminated or revoked sooner. Performed at [facility], [address], [place], [place]          Radiology Studies: CT ABDOMEN PELVIS W CONTRAST  Result Date: 03/04/2020 CLINICAL DATA:  Lower abdominal pain for 2 days with nausea. Intermittent urinary incontinence for 2 months. EXAM: CT ABDOMEN AND PELVIS WITH CONTRAST TECHNIQUE: Multidetector CT imaging of the abdomen and pelvis was performed using the standard protocol following bolus administration of intravenous contrast. CONTRAST:  03/06/2020 OMNIPAQUE IOHEXOL 300 MG/ML  SOLN COMPARISON:  05/18/2003 CT abdomen/pelvis. 12/24/2004 CT pelvis. FINDINGS: Lower chest: No significant pulmonary nodules or acute consolidative airspace disease. Hepatobiliary: Normal liver size. No liver mass. Cholecystectomy. Bile ducts are within normal post cholecystectomy limits. 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 No pancreatic mass. Spleen: Normal size. No mass. Adrenals/Urinary Tract: Normal adrenals. Nonobstructing 3 mm lower right renal stone. Otherwise normal kidneys, with no hydronephrosis and no renal masses. Normal bladder. Stomach/Bowel: Normal non-distended stomach. Normal caliber small bowel with no small bowel wall thickening. Normal appendix. Moderate diffuse colonic diverticulosis, most prominent in the sigmoid colon. Segmental wall thickening in mid sigmoid colon with associated pericolonic fat stranding, compatible with acute sigmoid diverticulitis (series 3/image 67). No pericolonic free air or abscess. Vascular/Lymphatic: Atherosclerotic  nonaneurysmal abdominal aorta. Patent portal, splenic, hepatic and renal veins. No pathologically enlarged lymph nodes in the abdomen or pelvis. Reproductive: Status post hysterectomy, with no abnormal findings at the vaginal cuff. No adnexal mass. Other: No pneumoperitoneum, ascites or focal fluid collection. Musculoskeletal: No aggressive appearing focal osseous lesions. Mild thoracolumbar spondylosis. IMPRESSION: 1. Acute sigmoid diverticulitis. No free air or abscess. 2. Nonobstructing right nephrolithiasis. 3. Aortic Atherosclerosis (ICD10-I70.0). Electronically Signed   By: Delbert Phenix M.D.   On: 03/04/2020 14:16        Scheduled Meds: . acetaminophen  1,000 mg Oral TID  . amLODipine  5 mg Oral Daily  . escitalopram  20 mg Oral Daily  . fluticasone furoate-vilanterol  1 puff Inhalation Daily  . gabapentin  300 mg Oral TID  . hydrochlorothiazide  25 mg Oral Daily  . losartan  50 mg Oral Daily  . polyethylene glycol  17 g Oral BID  . rosuvastatin  10 mg Oral QHS   Continuous Infusions:   LOS: 1 day    Time spent: 25 minutes    Alberteen Sam, MD [facility] 03/06/2020, 12:03 PM     Please page though AMION or Epic secure chat:  For [employer], Higher education careers adviser

## 2020-03-07 LAB — CBC
HCT: 34.5 % — ABNORMAL LOW (ref 36.0–46.0)
Hemoglobin: 10.9 g/dL — ABNORMAL LOW (ref 12.0–15.0)
MCH: 26.8 pg (ref 26.0–34.0)
MCHC: 31.6 g/dL (ref 30.0–36.0)
MCV: 85 fL (ref 80.0–100.0)
Platelets: 280 10*3/uL (ref 150–400)
RBC: 4.06 MIL/uL (ref 3.87–5.11)
RDW: 13.6 % (ref 11.5–15.5)
WBC: 4.1 10*3/uL (ref 4.0–10.5)
nRBC: 0 % (ref 0.0–0.2)

## 2020-03-07 MED ORDER — HYDRALAZINE HCL 25 MG PO TABS: 25.0000 mg | ORAL_TABLET | Freq: Three times a day (TID) | ORAL | Status: DC | PRN

## 2020-03-07 NOTE — Progress Notes (Signed)
PROGRESS NOTE    Diana Mendez  YME:[id number] DOB: 06-21-1961 DOA: 03/04/2020 PCP: Lorenda Ishihara, MD      Brief Narrative:  Diana Mendez is a 59 y.o. F with hx HTN and previous diverticulitis who presented with abdominal pain.  CT showed sigmoid diverticulitis.  Started on antibiotics and admitted to hospitalist service.      Assessment & Plan:  Diverticulitis Pain slightly improved from yesterday, some response to NSAIDs.  Gen Surg recommend continued IV antibiotics and imaging if pain not improved tomorrow -Continue Unasyn -Continue ibuprofen and acetaminophen    Hypertension BP readings labile, mostly high.  Diana Mendez now back to home regimen as of yesterday.  Elevated readings may be related to NSAIDs.  Data shows that escalation of antihypertensives while in hospital for patients controlled as outpatients is associated with more adverse events, no improvement in outcomes Dareen Piano JAMA Int Med 2019) -Continue amlodipine, losartan, HCTZ  Hyperlipidemia -Continue Crestor  Bronchospastic disease No active disase -Continue home inhalers          Disposition: The Diana Mendez was admitted with acute diverticulitis.  She is now able to tolerate a regular diet, but continues to have severe pain, and general surgery recommend continued IV antibiotics.  If her pain is improved tomorrow, likely home with Augmentin, general surgery follow-up.  If her pain does not appear to be improved tomorrow, likely CT abdomen and pelvis repeat per surgery.        MDM: The below labs and imaging reports reviewed and summarized above.  Medication management as above.          Subjective: Diana Mendez able to tolerate solid food.  Had a bowel movement.  No hematochezia or melena.  No confusion, fever, vomiting.  No focal weakness, numbness.  Objective: Vitals:   03/06/20 1440 03/06/20 1603 03/06/20 2152 03/07/20 0405  BP: (!) 188/120 125/73 (!) 177/96 (!) 167/93   Pulse: 80 70 65 68  Resp:   14 16  Temp: 98.4 F (36.9 C)  97.9 F (36.6 C) 98.7 F (37.1 C)  TempSrc: Oral  Oral Oral  SpO2: 99%  100% 98%  Weight:      Height:        Intake/Output Summary (Last 24 hours) at 03/07/2020 0943 Last data filed at 03/07/2020 0830 Gross per 24 hour  Intake 1020 ml  Output --  Net 1020 ml   Filed Weights   03/04/20 2049 03/06/20 0502  Weight: 71.7 kg 60.3 kg    Examination: General appearance: well nourished adult female, alert and in no acute distress.  Lying in bed, watching TV HEENT: Anicteric, conjunctiva pink, lids and lashes normal. No nasal deformity, discharge, epistaxis.  Lips moist, teeth normal. OP normal, no oral lesions.   Skin: Warm and dry.  No suspicious rashes or lesions. Cardiac: RRR, no murmurs appreciated.  No LE edema.    Respiratory: Normal respiratory rate and rhythm.  CTAB without rales or wheezes. Abdomen: Abdomen soft.  Moderate tenderness to palpation, right lower quadrant, improved yesterday.  No rebound, rigidity.  Guarding is noted.. No ascites, distension, hepatosplenomegaly.   MSK: No deformities or effusions of the large joints of the upper or lower extremities bilaterally. Neuro: Awake and alert. Naming is grossly intact, and the Diana Mendez's recall, recent and remote, as well as general fund of knowledge seem within normal limits.  Muscle tone normal, without fasciculations.  Moves all extremities equally and with normal coordination.  Speech fluent.    Psych: Sensorium intact and  responding to questions, attention normal. Affect normal.  Judgment and insight appear normal.      Data Reviewed: I have personally reviewed following labs and imaging studies:  CBC: Recent Labs  Lab 03/04/20 1123 03/05/20 0346 03/06/20 0230 03/07/20 0831  WBC 9.3 8.0 5.9 4.1  HGB 12.3 10.7* 10.5* 10.9*  HCT 39.3 33.2* 32.6* 34.5*  MCV 87.7 84.7 84.7 85.0  PLT 298 250 246 280   Basic Metabolic Panel: Recent Labs  Lab  03/04/20 1123 03/05/20 0346 03/06/20 0230  NA 141 139 140  K 4.1 3.6 3.6  CL 106 105 107  CO2 25 25 26   GLUCOSE 119* 106* 107*  BUN 13 9 6   CREATININE 0.81 0.69 0.73  CALCIUM 9.1 8.5* 8.3*   GFR: Estimated Creatinine Clearance: 71.2 mL/min (by C-G formula based on SCr of 0.73 mg/dL). Liver Function Tests: Recent Labs  Lab 03/04/20 1123  AST 18  ALT 15  ALKPHOS 92  BILITOT 0.9  PROT 7.4  ALBUMIN 3.9   Recent Labs  Lab 03/04/20 1123  LIPASE 19   No results for input(s): AMMONIA in the last 168 hours. Coagulation Profile: No results for input(s): INR, PROTIME in the last 168 hours. Cardiac Enzymes: No results for input(s): CKTOTAL, CKMB, CKMBINDEX, TROPONINI in the last 168 hours. BNP (last 3 results) No results for input(s): PROBNP in the last 8760 hours. HbA1C: Recent Labs    03/05/20 0346  HGBA1C 6.8*   CBG: No results for input(s): GLUCAP in the last 168 hours. Lipid Profile: Recent Labs    03/05/20 0346  CHOL 193  HDL 48  LDLCALC 121*  TRIG 118  CHOLHDL 4.0   Thyroid Function Tests: No results for input(s): TSH, T4TOTAL, FREET4, T3FREE, THYROIDAB in the last 72 hours. Anemia Panel: No results for input(s): VITAMINB12, FOLATE, FERRITIN, TIBC, IRON, RETICCTPCT in the last 72 hours. Urine analysis:    Component Value Date/Time   COLORURINE YELLOW 03/04/2020 1422   APPEARANCEUR CLEAR 03/04/2020 1422   LABSPEC 1.041 (H) 03/04/2020 1422   PHURINE 5.0 03/04/2020 1422   GLUCOSEU NEGATIVE 03/04/2020 1422   HGBUR NEGATIVE 03/04/2020 1422   BILIRUBINUR NEGATIVE 03/04/2020 1422   KETONESUR NEGATIVE 03/04/2020 1422   PROTEINUR NEGATIVE 03/04/2020 1422   NITRITE NEGATIVE 03/04/2020 1422   LEUKOCYTESUR NEGATIVE 03/04/2020 1422   Sepsis Labs: @LABRCNTIP (procalcitonin:4,lacticacidven:4)  ) Recent Results (from the past 240 hour(s))  SARS CORONAVIRUS 2 (TAT 6-24 HRS) Nasopharyngeal Nasopharyngeal Swab     Status: None   Collection Time: 03/04/20  5:12  PM   Specimen: Nasopharyngeal Swab  Result Value Ref Range Status   SARS Coronavirus 2 NEGATIVE NEGATIVE Final    Comment: (NOTE) SARS-CoV-2 target nucleic acids are NOT DETECTED. The SARS-CoV-2 RNA is generally detectable in upper and lower respiratory specimens during the acute phase of infection. Negative results do not preclude SARS-CoV-2 infection, do not rule out co-infections with other pathogens, and should not be used as the sole basis for treatment or other Diana Mendez management decisions. Negative results must be combined with clinical observations, Diana Mendez history, and epidemiological information. The expected result is Negative. Fact Sheet for Patients: 03/06/2020 Fact Sheet for Healthcare Providers: This test is not yet approved or cleared by the 03/06/20 FDA and  has been authorized for detection and/or diagnosis of SARS-CoV-2 by FDA under an Emergency Use Authorization (EUA). This EUA will remain  in effect (meaning this test can be used) for the duration of the COVID-19 declaration under Section 56  4(b)(1) of the Act, 21 U.S.C. section 360bbb-3(b)(1), unless the authorization is terminated or revoked sooner. Performed at [facility], [place] [address] [place], [place] [address]          Radiology Studies: No results found.      Scheduled Meds: . acetaminophen  1,000 mg Oral TID  . amLODipine  5 mg Oral Daily  . escitalopram  20 mg Oral Daily  . fluticasone furoate-vilanterol  1 puff Inhalation Daily  . gabapentin  300 mg Oral TID  . hydrochlorothiazide  25 mg Oral Daily  . losartan  50 mg Oral Daily  . polyethylene glycol  17 g Oral BID  . rosuvastatin  10 mg Oral QHS   Continuous Infusions: . ampicillin-sulbactam (UNASYN) IV 3 g (03/07/20 0821)     LOS: 2 days    Time spent: 25 minutes    Edwin Dada, MD [facility] 03/07/2020, 9:43 AM      Please page though [place] or Epic secure chat:  For [employer], Adult nurse

## 2020-03-07 NOTE — Plan of Care (Signed)
  Problem: Education: Goal: Knowledge of General Education information will improve Description: Including pain rating scale, medication(s)/side effects and non-pharmacologic comfort measures Outcome: Progressing   Problem: Health Behavior/Discharge Planning: Goal: Ability to manage health-related needs will improve Outcome: Progressing   Problem: Clinical Measurements: Goal: Ability to maintain clinical measurements within normal limits will improve Outcome: Progressing Goal: Will remain free from infection Outcome: Progressing Goal: Respiratory complications will improve Outcome: Progressing   Problem: Activity: Goal: Risk for activity intolerance will decrease Outcome: Progressing   Problem: Nutrition: Goal: Adequate nutrition will be maintained Outcome: Progressing   Problem: Pain Managment: Goal: General experience of comfort will improve Outcome: Progressing   Problem: Safety: Goal: Ability to remain free from injury will improve Outcome: Progressing   Problem: Skin Integrity: Goal: Risk for impaired skin integrity will decrease Outcome: Progressing   

## 2020-03-07 NOTE — Progress Notes (Signed)
[facility] Progress Note     Subjective: CC-  Patient was advanced to a regular diet and switched to unasyn yesterday. States that she continues to have LLQ abdominal pain, no improvement from 24 hours ago. Denies n/v. Passing flatus. 2 loose BMs yesterday and 1 this morning.  Objective: Vital signs in last 24 hours: Temp:  [97.9 F (36.6 C)-98.7 F (37.1 C)] 98.7 F (37.1 C) (03/30 0405) Pulse Rate:  [65-80] 68 (03/30 0405) Resp:  [14-16] 16 (03/30 0405) BP: (125-188)/(73-120) 167/93 (03/30 0405) SpO2:  [98 %-100 %] 98 % (03/30 0405) Last BM Date: 03/05/20  Intake/Output from previous day: 03/29 0701 - 03/30 0700 In: 660 [P.O.:360; IV Piggyback:300] Out: -  Intake/Output this shift: No intake/output data recorded.  PE: Gen:  Alert, NAD, pleasant HEENT: EOM's intact, pupils equal and round Card:  RRR Pulm:  CTAB, no W/R/R, rate and effort normal Abd: Soft, mild distension, +BS, no HSM, TTP LLQ with some voluntary guarding, no rebound or peritonitis Skin: no rashes noted, warm and dry  Lab Results:  Recent Labs    03/05/20 0346 03/06/20 0230  WBC 8.0 5.9  HGB 10.7* 10.5*  HCT 33.2* 32.6*  PLT 250 246   BMET Recent Labs    03/05/20 0346 03/06/20 0230  NA 139 140  K 3.6 3.6  CL 105 107  CO2 25 26  GLUCOSE 106* 107*  BUN 9 6  CREATININE 0.69 0.73  CALCIUM 8.5* 8.3*   PT/INR No results for input(s): LABPROT, INR in the last 72 hours. CMP     Component Value Date/Time   NA 140 03/06/2020 0230   K 3.6 03/06/2020 0230   CL 107 03/06/2020 0230   CO2 26 03/06/2020 0230   GLUCOSE 107 (H) 03/06/2020 0230   BUN 6 03/06/2020 0230   CREATININE 0.73 03/06/2020 0230   CALCIUM 8.3 (L) 03/06/2020 0230   PROT 7.4 03/04/2020 1123   ALBUMIN 3.9 03/04/2020 1123   AST 18 03/04/2020 1123   ALT 15 03/04/2020 1123   ALKPHOS 92 03/04/2020 1123   BILITOT 0.9 03/04/2020 1123   GFRNONAA >60 03/06/2020 0230   GFRAA >60 03/06/2020 0230   Lipase      Component Value Date/Time   LIPASE 19 03/04/2020 1123       Studies/Results: No results found.  Anti-infectives: Anti-infectives (From admission, onward)   Start     Dose/Rate Route Frequency Ordered Stop   03/06/20 1400  Ampicillin-Sulbactam (UNASYN) 3 g in sodium chloride 0.9 % 100 mL IVPB     3 g 200 mL/hr over 30 Minutes Intravenous Every 6 hours 03/06/20 1244     03/06/20 1300  cefTRIAXone (ROCEPHIN) 2 g in sodium chloride 0.9 % 100 mL IVPB  Status:  Discontinued     2 g 200 mL/hr over 30 Minutes Intravenous Every 24 hours 03/06/20 1158 03/06/20 1201   03/06/20 1200  metroNIDAZOLE (FLAGYL) IVPB 500 mg  Status:  Discontinued     500 mg 100 mL/hr over 60 Minutes Intravenous Every 8 hours 03/06/20 1158 03/06/20 1201   03/05/20 2230  piperacillin-tazobactam (ZOSYN) IVPB 3.375 g  Status:  Discontinued     3.375 g 12.5 mL/hr over 240 Minutes Intravenous Every 8 hours 03/04/20 1637 03/06/20 1158   03/04/20 1645  piperacillin-tazobactam (ZOSYN) IVPB 3.375 g     3.375 g 100 mL/hr over 30 Minutes Intravenous  Once 03/04/20 1637 03/04/20 1836   03/04/20 1630  piperacillin-tazobactam (ZOSYN) IVPB 3.375 g  Status:  Discontinued     3.375 g 100 mL/hr over 30 Minutes Intravenous  Once 03/04/20 1628 03/04/20 1637       Assessment/Plan DM HTN - BP elevated, discuss with primary HLD  Acute sigmoid diverticulitis - 2nd bout, last colonoscopy about 7 years ago in [place] - CT scan 3/27 showed acute sigmoid diverticulitis with no free air or abscess  ID - zosyn 3/27>>3/29, unasyn 3/29>> FEN - reg diet VTE - SCDs, ok for chemical DVT prophylaxis from surgical standpoint Foley - none Follow up - TBD  Plan: Persistent LLQ pain. Check CBC. Continue IV antibiotics. If pain persists will consider repeat CT scan.   LOS: 2 days    Franne Forts, [facility] 03/07/2020, 8:00 AM Please see Amion for pager number during day hours 7:00am-4:30pm

## 2020-03-08 LAB — COMPREHENSIVE METABOLIC PANEL
ALT: 15 U/L (ref 0–44)
AST: 18 U/L (ref 15–41)
Albumin: 3.3 g/dL — ABNORMAL LOW (ref 3.5–5.0)
Alkaline Phosphatase: 74 U/L (ref 38–126)
Anion gap: 11 (ref 5–15)
BUN: 14 mg/dL (ref 6–20)
CO2: 27 mmol/L (ref 22–32)
Calcium: 9.2 mg/dL (ref 8.9–10.3)
Chloride: 101 mmol/L (ref 98–111)
Creatinine, Ser: 0.77 mg/dL (ref 0.44–1.00)
GFR calc Af Amer: >60 mL/min (ref >60)
GFR calc non Af Amer: >60 mL/min (ref >60)
Glucose, Bld: 116 mg/dL — ABNORMAL HIGH (ref 70–99)
Potassium: 3.9 mmol/L (ref 3.5–5.1)
Sodium: 139 mmol/L (ref 135–145)
Total Bilirubin: 0.3 mg/dL (ref 0.3–1.2)
Total Protein: 7 g/dL (ref 6.5–8.1)

## 2020-03-08 LAB — CBC
HCT: 35.4 % — ABNORMAL LOW (ref 36.0–46.0)
Hemoglobin: 11.3 g/dL — ABNORMAL LOW (ref 12.0–15.0)
MCH: 27 pg (ref 26.0–34.0)
MCHC: 31.9 g/dL (ref 30.0–36.0)
MCV: 84.7 fL (ref 80.0–100.0)
Platelets: 279 10*3/uL (ref 150–400)
RBC: 4.18 MIL/uL (ref 3.87–5.11)
RDW: 13.3 % (ref 11.5–15.5)
WBC: 5.4 10*3/uL (ref 4.0–10.5)
nRBC: 0 % (ref 0.0–0.2)

## 2020-03-08 MED ORDER — AMOXICILLIN-POT CLAVULANATE 875-125 MG PO TABS: 1.0000 | ORAL_TABLET | Freq: Two times a day (BID) | ORAL | 0 refills | Status: AC

## 2020-03-08 MED ORDER — IBUPROFEN 400 MG PO TABS: 400.0000 mg | ORAL_TABLET | Freq: Four times a day (QID) | ORAL | 0 refills | Status: DC | PRN

## 2020-03-08 MED ORDER — ACETAMINOPHEN 500 MG PO TABS: 1000.0000 mg | ORAL_TABLET | Freq: Three times a day (TID) | ORAL | 0 refills | Status: DC

## 2020-03-08 NOTE — Progress Notes (Signed)
VS stable, denies pain, education was given. AVS paper discussed, answered all the questions. Belongings at the bedside.  Pt is waiting on the ride to [place] home 

## 2020-03-08 NOTE — Discharge Summary (Signed)
Physician Discharge Summary [facility]    Patient: Diana Mendez                   Admit date: 03/04/2020   DOB: 06-Oct-1961             Discharge date:03/08/2020/8:33 AM QQP:[id number]                          PCP: Lorenda Ishihara, MD  Disposition: Home   Recommendations for Outpatient Follow-up:   Follow up: Recommend 10 days of oral antibiotics (augmentin). Referral to sent to gastroenterology for colonoscopy in 6-8 weeks. Follow up with colorectal surgeon after colonoscopy.  Discharge Condition: Stable   Code Status:   Code Status: Full Code  Diet recommendation: Diabetic diet   Discharge Diagnoses:    Active Problems:   Acute diverticulitis   Diverticulitis   DM type 2 (diabetes mellitus, type 2) (HCC)   History of Present Illness/ Hospital Course [name] Summary:   OCIE STANZIONE is a 59 y.o. female with medical history significant of recurrent diverticulitis x4 in the past and last episode was about 5 years ago while living in [place], and HTN presented with new lower abd pain for 2 days, was episodic and diffused at the beginning then migrated to LLQ and persisted since last night, feeling nauseous no vomit, had one soft BM yesterday, no diarrhea, no fever or chills, no dysuria. ED Course: CT showing acute diverticulitis and right non-obstructing renal stone  Hospital course: Patient was admitted started on IV antibiotics of Unasyn, was kept n.p.o., general surgery was consulted. Patient was subsequently had improvement in symptoms, covered p.o., surgery was cleared patient to be discharged home with a recommendation of Augmentin follow-up 6 to 8 weeks for possible colonoscopy with GI  Diverticulitis -Abdominal pain has improved -Tolerating p.o.  Gen Surg recommend IV antibiotics through switched to p.o. Augmentin,  -Discontinue IV Unasyn -Continue ibuprofen and acetaminophen    Hypertension BP readings labile, mostly high.  Patient now  back to home regimen as of yesterday.  Elevated readings may be related to NSAIDs.  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      Nutritional status:          Discharge Instructions:   Discharge Instructions    Activity as tolerated - No restrictions   Complete by: As directed    Ambulatory referral to Gastroenterology   Complete by: As directed    2nd bout of diverticulitis, need colonoscopy 6-8 weeks   What is the reason for referral?: Colonoscopy   Call MD for:  persistant nausea and vomiting   Complete by: As directed    Call MD for:  severe uncontrolled pain   Complete by: As directed    Call MD for:  temperature >100.4   Complete by: As directed    Diet Carb Modified   Complete by: As directed    Diet full liquid   Complete by: As directed    Discharge instructions   Complete by: As directed    Recommend 10 days of oral antibiotics (augmentin). Referral to sent to gastroenterology for colonoscopy in  6-8 weeks. Follow up with colorectal surgeon after colonoscopy.   Increase activity slowly   Complete by: As directed        Medication List    TAKE these medications   acetaminophen 500 MG tablet Commonly known as: TYLENOL Take 2 tablets (1,000 mg total) by mouth 3 (three) times daily.   albuterol 108 (90 Base) MCG/ACT inhaler Commonly known as: VENTOLIN HFA Inhale 1 puff into the lungs in the morning and at bedtime.   amLODipine 5 MG tablet Commonly known as: NORVASC Take 1 tablet (5 mg total) by mouth daily.   amoxicillin-clavulanate 875-125 MG  tablet Commonly known as: Augmentin Take 1 tablet by mouth every 12 (twelve) hours for 10 days.   Breo Ellipta 200-25 MCG/INH Aepb Generic drug: fluticasone furoate-vilanterol Inhale 1 puff into the lungs daily.   escitalopram 20 MG tablet Commonly known as: LEXAPRO Take 20 mg by mouth daily.   gabapentin 300 MG capsule Commonly known as: NEURONTIN Take 300 mg by mouth 3 (three) times daily.   ibuprofen 400 MG tablet Commonly known as: ADVIL Take 1 tablet (400 mg total) by mouth every 6 (six) hours as needed for moderate pain.   metFORMIN 500 MG tablet Commonly known as: GLUCOPHAGE Take 500 mg by mouth 2 (two) times daily with a meal.   rosuvastatin 10 MG tablet Commonly known as: CRESTOR Take 10 mg by mouth at bedtime.   traZODone 150 MG tablet Commonly known as: DESYREL Take 150 mg by mouth at bedtime.   valsartan-hydrochlorothiazide 160-12.5 MG tablet Commonly known as: DIOVAN-HCT Take 1 tablet by mouth daily.      Follow-up Information    Call  Forrestine Him, [place].   Specialty: Family Medicine Why: Regarding today's encounter. Contact information: [place] [address] [facility]. Call.   Specialty: Gastroenterology Why: Their office should call you to schedule a colonoscopy Contact information: [place] [address] [facility], [place]. Call.   Specialty: General Surgery Why: Call to arrange follow up with colorectal surgeon after your colonoscopy Contact information: [place] [address] [phone]         Allergies  Allergen Reactions  . Flexeril [Cyclobenzaprine] Anaphylaxis  . Lisinopril Diarrhea     Procedures /Studies:   CT ABDOMEN PELVIS W CONTRAST  Result Date: 03/04/2020 CLINICAL DATA:  Lower abdominal pain for 2 days with nausea. Intermittent urinary  incontinence for 2 months. EXAM: CT ABDOMEN AND PELVIS WITH CONTRAST TECHNIQUE: Multidetector CT imaging of the abdomen and pelvis was performed using the standard protocol following bolus administration of intravenous contrast. CONTRAST:  146mL OMNIPAQUE IOHEXOL 300 MG/ML  SOLN COMPARISON:  05/18/2003 CT abdomen/pelvis. 12/24/2004 CT pelvis. FINDINGS: Lower chest: No significant pulmonary nodules or acute consolidative airspace disease. Hepatobiliary: Normal liver size. No liver mass. Cholecystectomy. Bile ducts are within normal post cholecystectomy limits. 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 No pancreatic mass. Spleen: Normal size. No mass. Adrenals/Urinary Tract: Normal adrenals. Nonobstructing 3 mm lower right renal stone. Otherwise normal kidneys, with no hydronephrosis and no renal masses. Normal bladder. Stomach/Bowel: Normal non-distended stomach. Normal caliber small bowel with no small bowel wall thickening. Normal appendix. Moderate diffuse colonic diverticulosis, most prominent in the sigmoid colon. Segmental wall thickening in mid  sigmoid colon with associated pericolonic fat stranding, compatible with acute sigmoid diverticulitis (series 3/image 67). No pericolonic free air or abscess. Vascular/Lymphatic: Atherosclerotic nonaneurysmal abdominal aorta. Patent portal, splenic, hepatic and renal veins. No pathologically enlarged lymph nodes in the abdomen or pelvis. Reproductive: Status post hysterectomy, with no abnormal findings at the vaginal cuff. No adnexal mass. Other: No pneumoperitoneum, ascites or focal fluid collection. Musculoskeletal: No aggressive appearing focal osseous lesions. Mild thoracolumbar spondylosis. IMPRESSION: 1. Acute sigmoid diverticulitis. No free air or abscess. 2. Nonobstructing right nephrolithiasis. 3. Aortic Atherosclerosis (ICD10-I70.0). Electronically Signed   By: Delbert Phenix M.D.   On: 03/04/2020 14:16     Subjective:   Patient was seen and examined 03/08/2020, 8:33 AM Patient stable today. No acute distress.  No issues overnight Stable for discharge.  Discharge Exam:    Vitals:   03/07/20 0405 03/07/20 1324 03/07/20 2239 03/08/20 0356  BP: (!) 167/93 (!) 157/73 (!) 173/87 (!) 169/87  Pulse: 68 72 68 66  Resp: 16 16 16 14   Temp: 98.7 F (37.1 C) 98.4 F (36.9 C) 98.9 F (37.2 C) 98.3 F (36.8 C)  TempSrc: Oral Oral Oral Oral  SpO2: 98% 100% 100% 97%  Weight:      Height:        General: Pt lying comfortably in bed & appears in no obvious distress. Cardiovascular: S1 & S2 heard, RRR, S1/S2 +. No murmurs, rubs, gallops or clicks. No JVD or pedal edema. Respiratory: Clear to auscultation without wheezing, rhonchi or crackles. No increased work of breathing. Abdominal:  Non-distended, non-tender & soft. No organomegaly or masses appreciated. Normal bowel sounds heard. CNS: Alert and oriented. No focal deficits. Extremities: no edema, no cyanosis    The results of significant diagnostics from this hospitalization (including imaging, microbiology, ancillary and laboratory) are listed below for reference.      Microbiology:   Recent Results (from the past 240 hour(s))  SARS CORONAVIRUS 2 (TAT 6-24 HRS) Nasopharyngeal Nasopharyngeal Swab     Status: None   Collection Time: 03/04/20  5:12 PM   Specimen: Nasopharyngeal Swab  Result Value Ref Range Status   SARS Coronavirus 2 NEGATIVE NEGATIVE Final    Comment: (NOTE) SARS-CoV-2 target nucleic acids are NOT DETECTED. The SARS-CoV-2 RNA is generally detectable in upper and lower respiratory specimens during the acute phase of infection. Negative results do not preclude SARS-CoV-2 infection, do not rule out co-infections with other pathogens, and should not be used as the sole basis for treatment or other patient management decisions. Negative results must be combined with clinical  observations, patient history, and epidemiological information. The expected result is Negative. Fact Sheet for Patients: 03/06/20 Fact Sheet for Healthcare Providers: HairSlick.no This test is not yet approved or cleared by the quierodirigir.com FDA and  has been authorized for detection and/or diagnosis of SARS-CoV-2 by FDA under an Emergency Use Authorization (EUA). This EUA will remain  in effect (meaning this test can be used) for the duration of the COVID-19 declaration under Section 56 4(b)(1) of the Act, 21 U.S.C. section 360bbb-3(b)(1), unless the authorization is terminated or revoked sooner. Performed at [facility], [address], [place], [place]      Labs:   CBC: Recent Labs  Lab 03/04/20 1123 03/05/20 0346 03/06/20 0230 03/07/20 0831 03/08/20 0146  WBC 9.3 8.0 5.9 4.1 5.4  HGB 12.3 10.7* 10.5* 10.9* 11.3*  HCT 39.3 33.2* 32.6* 34.5* 35.4*  MCV 87.7 84.7 84.7 85.0 84.7  PLT 298 250 246 280 279   Basic Metabolic Panel: Recent Labs  Lab 03/04/20 1123 03/05/20 0346 03/06/20 0230 03/08/20 0146  NA 141 139 140 139  K 4.1 3.6 3.6 3.9  CL 106 105 107 101  CO2 25 25 26 27   GLUCOSE 119* 106* 107* 116*  BUN 13 9 6 14   CREATININE 0.81 0.69 0.73 0.77  CALCIUM 9.1 8.5* 8.3* 9.2   Liver Function Tests: Recent Labs  Lab 03/04/20 1123 03/08/20 0146  AST 18 18  ALT 15 15  ALKPHOS 92 74  BILITOT 0.9 0.3  PROT 7.4 7.0  ALBUMIN 3.9 3.3*      Component Value Date/Time   COLORURINE YELLOW 03/04/2020 1422   APPEARANCEUR CLEAR 03/04/2020 1422   LABSPEC 1.041 (H) 03/04/2020 1422   PHURINE 5.0 03/04/2020 1422   GLUCOSEU NEGATIVE 03/04/2020 1422   HGBUR NEGATIVE 03/04/2020 1422   BILIRUBINUR NEGATIVE 03/04/2020 1422   KETONESUR NEGATIVE 03/04/2020 1422   PROTEINUR NEGATIVE 03/04/2020 1422   NITRITE NEGATIVE 03/04/2020 1422   LEUKOCYTESUR NEGATIVE 03/04/2020 1422         Time  coordinating discharge: Over 45 minutes  SIGNED: 03/06/2020, MD, FACP, FHM. [facility],  Pager [phone]  If 7PM-7AM, please contact night-coverage [email or web address] [facility] 03/08/2020, 8:33 AM

## 2020-03-08 NOTE — Discharge Instructions (Signed)
Diverticulitis  Diverticulitis is when small pockets in your large intestine (colon) get infected or swollen. This causes stomach pain and watery poop (diarrhea). These pouches are called diverticula. They form in people who have a condition called diverticulosis. Follow these instructions at home: Medicines  Take over-the-counter and prescription medicines only as told by your doctor. These include: ? Antibiotics. ? Pain medicines. ? Fiber pills. ? Probiotics. ? Stool softeners.  Do not drive or use heavy machinery while taking prescription pain medicine.  If you were prescribed an antibiotic, take it as told. Do not stop taking it even if you feel better. General instructions   Follow a diet as told by your doctor.  When you feel better, your doctor may tell you to change your diet. You may need to eat a lot of fiber. Fiber makes it easier to poop (have bowel movements). Healthy foods with fiber include: ? Berries. ? Beans. ? Lentils. ? Green vegetables.  Exercise 3 or more times a week. Aim for 30 minutes each time. Exercise enough to sweat and make your heart beat faster.  Keep all follow-up visits as told. This is important. You may need to have an exam of the large intestine. This is called a colonoscopy. Contact a doctor if:  Your pain does not get better.  You have a hard time eating or drinking.  You are not pooping like normal. Get help right away if:  Your pain gets worse.  Your problems do not get better.  Your problems get worse very fast.  You have a fever.  You throw up (vomit) more than one time.  You have poop that is: ? Bloody. ? Black. ? Tarry. Summary  Diverticulitis is when small pockets in your large intestine (colon) get infected or swollen.  Take medicines only as told by your doctor.  Follow a diet as told by your doctor. This information is not intended to replace advice given to you by your health care provider. Make sure you  discuss any questions you have with your health care provider. Document Revised: 11/07/2017 Document Reviewed: 12/12/2016 Elsevier Patient Education  2020 [employer] Inc[employer]   Low-Fiber Eating Plan Fiber is found in fruits, vegetables, whole grains, and beans. Eating a diet low in fiber helps to reduce how often you have bowel movements and how much you produce during a bowel movement. A low-fiber eating plan may help your digestive system heal if:  You have certain conditions, such as Crohn's disease or diverticulitis.  You recently had radiation therapy on your pelvis or bowel.  You recently had intestinal surgery.  You have a new surgical opening in your abdomen (colostomy or ileostomy).  Your intestine is narrowed (stricture). Your health care provider will determine how long you need to stay on this diet. Your health care provider may recommend that you work with a diet and nutrition specialist (dietitian). What are tips for following this plan? General guidelines  Follow recommendations from your dietitian about how much fiber you should have each day.  Most people on this eating plan should try to eat less than 10 grams (g) of fiber each day. Your daily fiber goal is _________________ g.  Take vitamin and mineral supplements as told by your health care provider or dietitian. Chewable or liquid forms are best when on this eating plan. Reading food labels  Check food labels for the amount of dietary fiber.  Choose foods that have less than 2 grams of fiber in one serving.   Cooking  Use white flour and other allowed grains for baking and cooking.  Cook meat using methods that keep it tender, such as braising or poaching.  Cook eggs until the yolk is completely solid.  Cook with healthy oils, such as olive oil or canola oil. Meal planning   Eat 5-6 small meals throughout the day instead of 3 large meals.  If you are lactose intolerant: ? Choose low-lactose dairy  foods. ? Do not eat dairy foods, if told by your dietitian.  Limit fat and oils to less than 8 teaspoons a day.  Eat small portions of desserts. What foods are allowed? The items listed below may not be a complete list. Talk with your dietitian about what dietary choices are best for you. Grains All bread and crackers made with white flour. Waffles, pancakes, and French toast. Bagels. Pretzels. Melba toast, zwieback, and matzoh. Cooked and dried cereals that do not contain whole grains, added fiber, seeds, or dried fruit. Cornmeal. Farina. Hot and cold cereals made with refined corn, wheat, rice, or oats. Plain pasta and noodles. White rice. Vegetables Well-cooked or canned vegetables without skin, seeds, or stems. Cooked potatoes without skins. Vegetable juice. Fruits Soft-cooked or canned fruits without skin and seeds. Peeled ripe banana. Applesauce. Fruit juice without pulp. Meats and other protein foods Ground meat. Tender cuts of meat or poultry. Eggs. Fish, seafood, and shellfish. Smooth nut butters. Tofu. Dairy All milk products and drinks. Lactose-free milks, including rice, soy, and almond milks. Yogurt without fruit, nuts, chocolate, or granola mix-ins. Sour cream. Cottage cheese. Cheese. Beverages Decaf coffee. Fruit and vegetable juices or smoothies (in small amounts, with no pulp or skins, and with fruits from allowed list). Sports drinks. Herbal tea. Fats and oils Olive oil, canola oil, sunflower oil, flaxseed oil, and grapeseed oil. Mayonnaise. Cream cheese. Margarine. Butter. Sweets and desserts Plain cakes and cookies. Cream pies and pies made with allowed fruits. Pudding. Custard. Fruit gelatin. Sherbet. Popsicles. Ice cream without nuts. Plain hard candy. Honey. Jelly. Molasses. Syrups, including chocolate syrup. Chocolate. Marshmallows. Gumdrops. Seasoning and other foods Bouillon. Broth. Cream soups made from allowed foods. Strained soup. Casseroles made with allowed  foods. Ketchup. Mild mustard. Mild salad dressings. Plain gravies. Vinegar. Spices in moderation. Salt. Sugar. What foods are not allowed? The items listed below may not be a complete list. Talk with your dietitian about what dietary choices are best for you. Grains Whole wheat and whole grain breads and crackers. Multigrain breads and crackers. Rye bread. Whole grain or multigrain cereals. Cereals with nuts, raisins, or coconut. Bran. Coarse wheat cereals. Granola. High-fiber cereals. Cornmeal or corn bread. Whole grain pasta. Wild or brown rice. Quinoa. Popcorn. Buckwheat. Wheat germ. Vegetables Potato skins. Raw or undercooked vegetables. All beans and bean sprouts. Cooked greens. Corn. Peas. Cabbage. Beets. Broccoli. Brussels sprouts. Cauliflower. Mushrooms. Onions. Peppers. Parsnips. Okra. Sauerkraut. Fruit Raw or dried fruit. Berries. Fruit juice with pulp. Prune juice. Meats and other protein foods Tough, fibrous meats with gristle. Fatty meat. Poultry with skin. Fried meat, poultry, or fish. Deli or lunch meats. Sausage, bacon, and hot dogs. Nuts and chunky nut butter. Dried peas, beans, and lentils. Dairy Yogurt with fruit, nuts, chocolate, or granola mix-ins. Beverages Caffeinated coffee and teas. Fats and oils Avocado. Coconut. Sweets and desserts Desserts, cookies, or candies that contain nuts or coconut. Dried fruit. Jams and preserves with seeds. Marmalade. Any dessert made with fruits or grains that are not allowed. Seasoning and other foods Corn tortilla chips.   Soups made with vegetables or grains that are not allowed. Relish. Horseradish. Pickles. Olives. Summary  Most people on a low-fiber eating plan should eat less than 10 grams of fiber a day. Follow recommendations from your dietitian about how much fiber you should have each day.  Always check food labels to see the dietary fiber content of packaged foods. In general, a low-fiber food will have fewer than 2 grams of  fiber per serving.  In general, try to avoid whole grains, raw fruits and vegetables, dried fruit, tough cuts of meat, nuts, and seeds.  Take a vitamin and mineral supplement as told by your health care provider or dietitian. This information is not intended to replace advice given to you by your health care provider. Make sure you discuss any questions you have with your health care provider. Document Revised: 03/19/2019 Document Reviewed: 01/28/2017 Elsevier Patient Education  2020 Else[employer] Inc.   High-Fiber Diet Fiber, also called dietary fiber, is a type of carbohydrate that is found in fruits, vegetables, whole grains, and beans. A high-fiber diet can have many health benefits. Your health care provider may recommend a high-fiber diet to help:  Prevent constipation. Fiber can make your bowel movements more regular.  Lower your cholesterol.  Relieve the following conditions: ? Swelling of veins in the anus (hemorrhoids). ? Swelling and irritation (inflammation) of specific areas of the digestive tract (uncomplicated diverticulosis). ? A problem of the large intestine (colon) that sometimes causes pain and diarrhea (irritable bowel syndrome, IBS).  Prevent overeating as part of a weight-loss plan.  Prevent heart disease, type 2 diabetes, and certain cancers. What is my plan? The recommended daily fiber intake in grams (g) includes:  38 g for men age 50 or younger.  30 g for men over age 50.  25 g for women age 50 or younger.  21 g for women over age 50. You can get the recommended daily intake of dietary fiber by:  Eating a variety of fruits, vegetables, grains, and beans.  Taking a fiber supplement, if it is not possible to get enough fiber through your diet. What do I need to know about a high-fiber diet?  It is better to get fiber through food sources rather than from fiber supplements. There is not a lot of research about how effective supplements are.  Always  check the fiber content on the nutrition facts label of any prepackaged food. Look for foods that contain 5 g of fiber or more per serving.  Talk with a diet and nutrition specialist (dietitian) if you have questions about specific foods that are recommended or not recommended for your medical condition, especially if those foods are not listed below.  Gradually increase how much fiber you consume. If you increase your intake of dietary fiber too quickly, you may have bloating, cramping, or gas.  Drink plenty of water. Water helps you to digest fiber. What are tips for following this plan?  Eat a wide variety of high-fiber foods.  Make sure that half of the grains that you eat each day are whole grains.  Eat breads and cereals that are made with whole-grain flour instead of refined flour or white flour.  Eat brown rice, bulgur wheat, or millet instead of white rice.  Start the day with a breakfast that is high in fiber, such as a cereal that contains 5 g of fiber or more per serving.  Use beans in place of meat in soups, salads, and pasta dishes.    Eat high-fiber snacks, such as berries, raw vegetables, nuts, and popcorn.  Choose whole fruits and vegetables instead of processed forms like juice or sauce. What foods can I eat?  Fruits Berries. Pears. Apples. Oranges. Avocado. Prunes and raisins. Dried figs. Vegetables Sweet potatoes. Spinach. Kale. Artichokes. Cabbage. Broccoli. Cauliflower. Green peas. Carrots. Squash. Grains Whole-grain breads. Multigrain cereal. Oats and oatmeal. Brown rice. Barley. Bulgur wheat. Millet. Quinoa. Bran muffins. Popcorn. Rye wafer crackers. Meats and other proteins Navy, kidney, and pinto beans. Soybeans. Split peas. Lentils. Nuts and seeds. Dairy Fiber-fortified yogurt. Beverages Fiber-fortified soy milk. Fiber-fortified orange juice. Other foods Fiber bars. The items listed above may not be a complete list of recommended foods and beverages.  Contact a dietitian for more options. What foods are not recommended? Fruits Fruit juice. Cooked, strained fruit. Vegetables Fried potatoes. Canned vegetables. Well-cooked vegetables. Grains White bread. Pasta made with refined flour. White rice. Meats and other proteins Fatty cuts of meat. Fried chicken or fried fish. Dairy Milk. Yogurt. Cream cheese. Sour cream. Fats and oils Butters. Beverages Soft drinks. Other foods Cakes and pastries. The items listed above may not be a complete list of foods and beverages to avoid. Contact a dietitian for more information. Summary  Fiber is a type of carbohydrate. It is found in fruits, vegetables, whole grains, and beans.  There are many health benefits of eating a high-fiber diet, such as preventing constipation, lowering blood cholesterol, helping with weight loss, and reducing your risk of heart disease, diabetes, and certain cancers.  Gradually increase your intake of fiber. Increasing too fast can result in cramping, bloating, and gas. Drink plenty of water while you increase your fiber.  The best sources of fiber include whole fruits and vegetables, whole grains, nuts, seeds, and beans. This information is not intended to replace advice given to you by your health care provider. Make sure you discuss any questions you have with your health care provider. Document Revised: 09/29/2017 Document Reviewed: 09/29/2017 Elsevier Patient Education  2020 Elsevier Inc.  

## 2020-03-08 NOTE — Progress Notes (Signed)
[facility] Progress Note     Subjective: CC-  Feeling much better today. Abdominal pain resolved. She is tolerating solid food and having bowel function. She reports having a few nonbloody loose BMs yesterday. Feels ready to go home.  Objective: Vital signs in last 24 hours: Temp:  [98.3 F (36.8 C)-98.9 F (37.2 C)] 98.3 F (36.8 C) (03/31 0356) Pulse Rate:  [66-72] 66 (03/31 0356) Resp:  [14-16] 14 (03/31 0356) BP: (157-173)/(73-87) 169/87 (03/31 0356) SpO2:  [97 %-100 %] 97 % (03/31 0356) Last BM Date: 03/07/20  Intake/Output from previous day: 03/30 0701 - 03/31 0700 In: 1720 [P.O.:1320; IV Piggyback:400] Out: -  Intake/Output this shift: No intake/output data recorded.  PE: Gen: Alert, NAD, pleasant HEENT: EOM's intact, pupils equal and round Card: RRR Pulm: CTAB, no W/R/R, rate and effort normal Abd: Soft,mild distension, +BS, no HSM,nontender, no rebound or peritonitis Skin: no rashes noted, warm and dry  Lab Results:  Recent Labs    03/07/20 0831 03/08/20 0146  WBC 4.1 5.4  HGB 10.9* 11.3*  HCT 34.5* 35.4*  PLT 280 279   BMET Recent Labs    03/06/20 0230 03/08/20 0146  NA 140 139  K 3.6 3.9  CL 107 101  CO2 26 27  GLUCOSE 107* 116*  BUN 6 14  CREATININE 0.73 0.77  CALCIUM 8.3* 9.2   PT/INR No results for input(s): LABPROT, INR in the last 72 hours. CMP     Component Value Date/Time   NA 139 03/08/2020 0146   K 3.9 03/08/2020 0146   CL 101 03/08/2020 0146   CO2 27 03/08/2020 0146   GLUCOSE 116 (H) 03/08/2020 0146   BUN 14 03/08/2020 0146   CREATININE 0.77 03/08/2020 0146   CALCIUM 9.2 03/08/2020 0146   PROT 7.0 03/08/2020 0146   ALBUMIN 3.3 (L) 03/08/2020 0146   AST 18 03/08/2020 0146   ALT 15 03/08/2020 0146   ALKPHOS 74 03/08/2020 0146   BILITOT 0.3 03/08/2020 0146   GFRNONAA >60 03/08/2020 0146   GFRAA >60 03/08/2020 0146   Lipase     Component Value Date/Time   LIPASE 19 03/04/2020 1123        Studies/Results: No results found.  Anti-infectives: Anti-infectives (From admission, onward)   Start     Dose/Rate Route Frequency Ordered Stop   03/06/20 1400  Ampicillin-Sulbactam (UNASYN) 3 g in sodium chloride 0.9 % 100 mL IVPB     3 g 200 mL/hr over 30 Minutes Intravenous Every 6 hours 03/06/20 1244     03/06/20 1300  cefTRIAXone (ROCEPHIN) 2 g in sodium chloride 0.9 % 100 mL IVPB  Status:  Discontinued     2 g 200 mL/hr over 30 Minutes Intravenous Every 24 hours 03/06/20 1158 03/06/20 1201   03/06/20 1200  metroNIDAZOLE (FLAGYL) IVPB 500 mg  Status:  Discontinued     500 mg 100 mL/hr over 60 Minutes Intravenous Every 8 hours 03/06/20 1158 03/06/20 1201   03/05/20 2230  piperacillin-tazobactam (ZOSYN) IVPB 3.375 g  Status:  Discontinued     3.375 g 12.5 mL/hr over 240 Minutes Intravenous Every 8 hours 03/04/20 1637 03/06/20 1158   03/04/20 1645  piperacillin-tazobactam (ZOSYN) IVPB 3.375 g     3.375 g 100 mL/hr over 30 Minutes Intravenous  Once 03/04/20 1637 03/04/20 1836   03/04/20 1630  piperacillin-tazobactam (ZOSYN) IVPB 3.375 g  Status:  Discontinued     3.375 g 100 mL/hr over 30 Minutes Intravenous  Once 03/04/20 1628 03/04/20  1637       Assessment/Plan DM HTN - BP elevated, discuss with primary HLD  Acute sigmoid diverticulitis -2nd bout, last colonoscopy about 7 years ago in [place] - CT scan 3/27 showed acute sigmoid diverticulitiswith no free air or abscess  ID -zosyn 3/27>>3/29, unasyn 3/29>> FEN -reg diet VTE -SCDs, ok for chemical DVT prophylaxis from surgical standpoint Foley -none Follow up - GI  Plan: [place] for discharge from surgical standpoint. Recommend 10 days of oral antibiotics (augmentin). Referral to sent to gastroenterology for colonoscopy in 6-8 weeks. Follow up with colorectal surgeon after colonoscopy. Discharge instructions on AVS.   LOS: 3 days    Wellington Hampshire, [facility] 03/08/2020, 8:14  AM Please see Amion for pager number during day hours 7:00am-4:30pm

## 2020-07-21 ENCOUNTER — Emergency Department (HOSPITAL_COMMUNITY): Payer: Medicare (Managed Care)

## 2020-07-21 ENCOUNTER — Encounter (HOSPITAL_COMMUNITY): Payer: Self-pay

## 2020-07-21 ENCOUNTER — Other Ambulatory Visit: Payer: Self-pay

## 2020-07-21 ENCOUNTER — Emergency Department (HOSPITAL_COMMUNITY)
Admission: EM | Admit: 2020-07-21 | Discharge: 2020-07-21 | Disposition: A | Discharge status: Home / Self Care | Payer: Medicare (Managed Care) | Attending: Emergency Medicine | Admitting: Emergency Medicine

## 2020-07-21 DIAGNOSIS — R0602 Shortness of breath: Secondary | ICD-10-CM | POA: Diagnosis not present

## 2020-07-21 DIAGNOSIS — R0789 Other chest pain: Secondary | ICD-10-CM | POA: Diagnosis present

## 2020-07-21 DIAGNOSIS — Z7984 Long term (current) use of oral hypoglycemic drugs: Secondary | ICD-10-CM | POA: Diagnosis not present

## 2020-07-21 DIAGNOSIS — J45909 Unspecified asthma, uncomplicated: Secondary | ICD-10-CM | POA: Diagnosis not present

## 2020-07-21 DIAGNOSIS — E119 Type 2 diabetes mellitus without complications: Secondary | ICD-10-CM | POA: Insufficient documentation

## 2020-07-21 DIAGNOSIS — Z79899 Other long term (current) drug therapy: Secondary | ICD-10-CM | POA: Diagnosis not present

## 2020-07-21 DIAGNOSIS — I10 Essential (primary) hypertension: Secondary | ICD-10-CM | POA: Insufficient documentation

## 2020-07-21 DIAGNOSIS — R05 Cough: Secondary | ICD-10-CM | POA: Diagnosis not present

## 2020-07-21 DIAGNOSIS — R079 Chest pain, unspecified: Secondary | ICD-10-CM

## 2020-07-21 HISTORY — DX: Pure hypercholesterolemia, unspecified: E78.00

## 2020-07-21 HISTORY — DX: Depression, unspecified: F32.A

## 2020-07-21 HISTORY — DX: Anxiety disorder, unspecified: F41.9

## 2020-07-21 HISTORY — DX: Unspecified asthma, uncomplicated: J45.909

## 2020-07-21 LAB — BASIC METABOLIC PANEL
Anion gap: 10 (ref 5–15)
BUN: 13 mg/dL (ref 6–20)
CO2: 24 mmol/L (ref 22–32)
Calcium: 9 mg/dL (ref 8.9–10.3)
Chloride: 107 mmol/L (ref 98–111)
Creatinine, Ser: 0.74 mg/dL (ref 0.44–1.00)
GFR calc Af Amer: >60 mL/min (ref >60)
GFR calc non Af Amer: >60 mL/min (ref >60)
Glucose, Bld: 94 mg/dL (ref 70–99)
Potassium: 3.6 mmol/L (ref 3.5–5.1)
Sodium: 141 mmol/L (ref 135–145)

## 2020-07-21 LAB — D-DIMER, QUANTITATIVE: D-Dimer, Quant: 0.6 ug/mL-FEU — ABNORMAL HIGH (ref 0.00–0.50)

## 2020-07-21 LAB — CBC
HCT: 39.2 % (ref 36.0–46.0)
Hemoglobin: 12.6 g/dL (ref 12.0–15.0)
MCH: 27.5 pg (ref 26.0–34.0)
MCHC: 32.1 g/dL (ref 30.0–36.0)
MCV: 85.6 fL (ref 80.0–100.0)
Platelets: 294 10*3/uL (ref 150–400)
RBC: 4.58 MIL/uL (ref 3.87–5.11)
RDW: 14.3 % (ref 11.5–15.5)
WBC: 5.9 10*3/uL (ref 4.0–10.5)
nRBC: 0 % (ref 0.0–0.2)

## 2020-07-21 LAB — I-STAT BETA HCG BLOOD, ED (MC, WL, AP ONLY): I-stat hCG, quantitative: <5 m[IU]/mL (ref <5)

## 2020-07-21 LAB — TROPONIN I (HIGH SENSITIVITY): Troponin I (High Sensitivity): 5 ng/L (ref <18)

## 2020-07-21 NOTE — ED Triage Notes (Signed)
Patient c/o intermittent mid lower chest pain that started last night.. Patient c/o slight SOB, but has a history of asthma and has been using her inhaler.

## 2020-07-21 NOTE — ED Notes (Signed)
Patient refused second trop at this time.

## 2020-07-21 NOTE — ED Provider Notes (Signed)
[facility]-EMERGENCY DEPT Provider Note   CSN: [id number] Arrival date & time: 07/21/20  1247     History Chief Complaint  Patient presents with  . Chest Pain    Diana Mendez is a 59 y.o. female.  HPI She presents for evaluation of lower chest pain that started last night.  She also complains of shortness of breath.  She is using an albuterol inhaler.  She complains of a persistent pressure-like feeling, 8/10, that started last night.  The pain has diminished but not gone away.  It occasionally gets worse, without provocation.  She has an occasional nonproductive cough.  She denies fever, chills, nausea, vomiting, weakness or dizziness.  No prior similar problems.  There are no other known modifying factors.    Past Medical History:  Diagnosis Date  . Anxiety   . Asthma   . Depression   . Diabetes mellitus without complication (HCC)   . High cholesterol   . Hypertension     Patient Active Problem List   Diagnosis Date Noted  . DM type 2 (diabetes mellitus, type 2) (HCC) 03/05/2020  . Acute diverticulitis 03/04/2020  . Diverticulitis 03/04/2020    Past Surgical History:  Procedure Laterality Date  . ABDOMINAL HYSTERECTOMY     partial  . BUNIONECTOMY    . cervical disectomy    . CHOLECYSTECTOMY    . TUBAL LIGATION       OB History   No obstetric history on file.     Family History  Problem Relation Age of Onset  . Hypertension Mother   . Diabetes Mother     Social History   Tobacco Use  . Smoking status: Never Smoker  . Smokeless tobacco: Never Used  Vaping Use  . Vaping Use: Never used  Substance Use Topics  . Alcohol use: Never  . Drug use: Never    Home Medications Prior to Admission medications   Medication Sig Start Date End Date Taking? Authorizing Provider  albuterol (VENTOLIN HFA) 108 (90 Base) MCG/ACT inhaler Inhale 1 puff into the lungs in the morning and at bedtime.   Yes [provider]  amLODipine  (NORVASC) 5 MG tablet Take 1 tablet (5 mg total) by mouth daily. 07/23/18 07/21/20 Yes Curatolo, Adam, DO  fluticasone furoate-vilanterol (BREO ELLIPTA) 200-25 MCG/INH AEPB Inhale 1 puff into the lungs daily.   Yes [provider]  metFORMIN (GLUCOPHAGE) 500 MG tablet Take 500 mg by mouth 2 (two) times daily with a meal.   Yes [provider]  rosuvastatin (CRESTOR) 10 MG tablet Take 10 mg by mouth at bedtime.   Yes [provider]  traZODone (DESYREL) 100 MG tablet Take 100 mg by mouth at bedtime.  03/20/20  Yes [provider]  valsartan-hydrochlorothiazide (DIOVAN-HCT) 320-25 MG tablet Take 1 tablet by mouth daily. 03/17/20  Yes [provider]  acetaminophen (TYLENOL) 500 MG tablet Take 2 tablets (1,000 mg total) by mouth 3 (three) times daily. Patient not taking: Reported on 07/21/2020 03/08/20   Kendell Bane, MD  ibuprofen (ADVIL) 400 MG tablet Take 1 tablet (400 mg total) by mouth every 6 (six) hours as needed for moderate pain. Patient not taking: Reported on 07/21/2020 03/08/20   Kendell Bane, MD    Allergies    Flexeril [cyclobenzaprine] and Lisinopril  Review of Systems   Review of Systems  All other systems reviewed and are negative.   Physical Exam Updated Vital Signs BP (!) 152/88 (BP Location:  Right Arm)   Pulse 73   Temp 98.2 F (36.8 C) (Oral)   Resp 16   Ht 5\' 8"  (1.727 m)   Wt 68 kg   SpO2 100%   BMI 22.81 kg/m   Physical Exam Vitals and nursing note reviewed.  Constitutional:      General: She is not in acute distress.    Appearance: She is well-developed. She is not ill-appearing, toxic-appearing or diaphoretic.  HENT:     Head: Normocephalic and atraumatic.     Right Ear: External ear normal.     Left Ear: External ear normal.     Mouth/Throat:     Mouth: Mucous membranes are moist.     Pharynx: No oropharyngeal exudate or posterior oropharyngeal erythema.  Eyes:     Conjunctiva/sclera: Conjunctivae  normal.     Pupils: Pupils are equal, round, and reactive to light.  Neck:     Trachea: Phonation normal.  Cardiovascular:     Rate and Rhythm: Normal rate and regular rhythm.     Heart sounds: Normal heart sounds.  Pulmonary:     Effort: Pulmonary effort is normal. No respiratory distress.     Breath sounds: Normal breath sounds. No stridor.  Chest:     Chest wall: Tenderness (Mild mid anterior tenderness without crepitation or deformity.) present.  Abdominal:     General: There is no distension.     Palpations: Abdomen is soft.     Tenderness: There is no abdominal tenderness.  Musculoskeletal:        General: No swelling or tenderness. Normal range of motion.     Cervical back: Normal range of motion and neck supple.     Right lower leg: No edema.     Left lower leg: No edema.  Skin:    General: Skin is warm and dry.  Neurological:     Mental Status: She is alert and oriented to person, place, and time.     Cranial Nerves: No cranial nerve deficit.     Sensory: No sensory deficit.     Motor: No abnormal muscle tone.     Coordination: Coordination normal.  Psychiatric:        Mood and Affect: Mood normal.        Behavior: Behavior normal.        Thought Content: Thought content normal.        Judgment: Judgment normal.     ED Results / Procedures / Treatments   Labs (all labs ordered are listed, but only abnormal results are displayed) Labs Reviewed  D-DIMER, QUANTITATIVE (NOT AT [facility]) - Abnormal; Notable for the following components:      Result Value   D-Dimer, Quant 0.60 (*)    All other components within normal limits  BASIC METABOLIC PANEL  CBC  I-STAT BETA HCG BLOOD, ED ([facility], [facility], [facility] ONLY)  TROPONIN I (HIGH SENSITIVITY)  TROPONIN I (HIGH SENSITIVITY)    EKG None  Radiology DG Chest 2 View  Result Date: 07/21/2020 CLINICAL DATA:  Chest pain. Additional history provided: Patient reports shortness of breath and mid chest pain since last night, history of  asthma. EXAM: CHEST - 2 VIEW COMPARISON:  Prior chest radiographs 05/06/2005 and earlier. FINDINGS: Cardiomegaly is new from the prior examination of 05/06/2005. Minimal linear atelectasis and/or scarring within the left lung base. No appreciable airspace consolidation or pulmonary edema. No evidence of pleural effusion or pneumothorax. No acute bony abnormality identified. Spinal fusion hardware at the cervicothoracic junction.  Surgical clips within the right upper quadrant of the abdomen. IMPRESSION: Cardiomegaly, which is new as compared to the prior chest radiographs of 05/06/2005. Minimal linear atelectasis and/or scarring within the left lung base. No airspace consolidation or pulmonary edema. Electronically Signed   By: Jackey LogeKyle  Golden DO   On: 07/21/2020 14:21    Procedures Procedures (including critical care time)  Medications Ordered in ED Medications - No data to display  ED Course  I have reviewed the triage vital signs and the nursing notes.  Pertinent labs & imaging results that were available during my care of the patient were reviewed by me and considered in my medical decision making (see chart for details).  Clinical Course as of Jul 22 1635  Fri Jul 21, 2020  1449 Normal  Troponin I (High Sensitivity) [EW]  1449 Normal  CBC [EW]  1449 Normal   I-Stat beta hCG blood, ED [EW]  1449 normal  Basic metabolic panel [EW]  1550 Borderline elevated, age-adjusted normal  D-dimer, quantitative (not at [facility])(!) [EW]  1632 Creatinine: 0.74 [EW]  1634 Cardiomegaly; no infiltrate or edema, interpreted by me  DG Chest 2 View [EW]    Clinical Course User Index [EW] Mancel BaleWentz, Stefen Juba, MD   MDM Rules/Calculators/A&P                           Patient Vitals for the past 24 hrs:  BP Temp Temp src Pulse Resp SpO2 Height Weight  07/21/20 1621 (!) 152/88 -- -- 73 16 100 % -- --  07/21/20 1534 (!) 155/91 -- -- 77 15 99 % 5\' 8"  (1.727 m) --  07/21/20 1445 134/90 -- -- 80 19 99 % -- --   07/21/20 1437 (!) 171/96 -- -- 79 -- 100 % -- --  07/21/20 1315 (!) 169/102 -- -- -- -- -- -- --  07/21/20 1313 -- -- -- -- -- 95 % -- --  07/21/20 1311 -- -- -- -- -- -- 5\' 8"  (1.727 m) 68 kg  07/21/20 1310 (!) 214/104 98.2 F (36.8 C) Oral 76 17 95 % -- --    4:32 PM Reevaluation with update and discussion. After initial assessment and treatment, an updated evaluation reveals she is eating and drinking fluids, and feeling better.  She states she is ready to go home.  She feels like her anxiety is bothering her because she is living in a place where there is "a lot of stress."  She denies being an unsafe place.  Findings discussed and questions answered. Mancel BaleElliott Chinwe Lope   Medical Decision Making:  This patient is presenting for evaluation of chest discomfort, which does require a range of treatment options, and is a complaint that involves a moderate risk of morbidity and mortality. The differential diagnoses include chest wall pain, anxiety, ACS. I decided to review old records, and in summary middle-aged female, presenting with nonspecific symptoms including chest pain..  I did not require additional historical information from anyone.  Clinical Laboratory Tests Ordered, included CBC, Metabolic panel and Troponin, pregnancy test, D-dimer. Review indicates no. Radiologic Tests Ordered, included chest x-ray.  I independently Visualized: Radiographic images, which show cardiomegaly without edema or infiltrate  Cardiac Monitor Tracing which shows normal sinus rhythm   Critical Interventions-clinical evaluation, testing, chest x-ray, observation and reassessment  After These Interventions, the Patient was reevaluated and was found stable for discharge.  Patient with significant anxiety likely contributing to chest discomfort.  Doubt ACS, PE,  pneumonia, injury.  CRITICAL CARE-no Performed by: Mancel Bale  Nursing Notes Reviewed/ Care Coordinated Applicable Imaging  Reviewed Interpretation of Laboratory Data incorporated into ED treatment  The patient appears reasonably screened and/or stabilized for discharge and I doubt any other medical condition or other [facility] requiring further screening, evaluation, or treatment in the ED at this time prior to discharge.  Plan: Home Medications-continue usual medicines and use Tylenol if needed for pain; Home Treatments-rest, fluids, meditation; return here if the recommended treatment, does not improve the symptoms; Recommended follow up-PCP of choice as needed     Final Clinical Impression(s) / ED Diagnoses Final diagnoses:  Nonspecific chest pain    Rx / DC Orders ED Discharge Orders    None       Mancel Bale, MD 07/21/20 1636

## 2020-07-21 NOTE — Discharge Instructions (Addendum)
Continue to work on relaxation and meditation to help your discomfort.  See the doctor of your choice if not better in 3 or 4 days.

## 2020-07-21 NOTE — ED Provider Notes (Signed)
   Date: 08/07/20  Rate: 71  Rhythm: normal sinus rhythm  QRS Axis: normal  PR and QT Intervals: normal  ST/T Wave abnormalities: nonspecific ST/T changes  PR and QRS Conduction Disutrbances:none  Narrative Interpretation:   Old EKG Reviewed: unchanged- 07/23/2018    Mancel Bale, MD 07/21/20 1416

## 2020-09-28 ENCOUNTER — Emergency Department (HOSPITAL_COMMUNITY)
Admission: EM | Admit: 2020-09-28 | Discharge: 2020-09-28 | Disposition: A | Discharge status: Home / Self Care | Payer: Medicare (Managed Care) | Attending: Emergency Medicine | Admitting: Emergency Medicine

## 2020-09-28 ENCOUNTER — Other Ambulatory Visit: Payer: Self-pay

## 2020-09-28 ENCOUNTER — Emergency Department (HOSPITAL_COMMUNITY): Payer: Medicare (Managed Care)

## 2020-09-28 ENCOUNTER — Encounter (HOSPITAL_COMMUNITY): Payer: Self-pay

## 2020-09-28 DIAGNOSIS — M79671 Pain in right foot: Secondary | ICD-10-CM | POA: Diagnosis present

## 2020-09-28 DIAGNOSIS — J45909 Unspecified asthma, uncomplicated: Secondary | ICD-10-CM | POA: Insufficient documentation

## 2020-09-28 DIAGNOSIS — E119 Type 2 diabetes mellitus without complications: Secondary | ICD-10-CM | POA: Insufficient documentation

## 2020-09-28 DIAGNOSIS — Z79899 Other long term (current) drug therapy: Secondary | ICD-10-CM | POA: Diagnosis not present

## 2020-09-28 DIAGNOSIS — I10 Essential (primary) hypertension: Secondary | ICD-10-CM | POA: Insufficient documentation

## 2020-09-28 DIAGNOSIS — Z7984 Long term (current) use of oral hypoglycemic drugs: Secondary | ICD-10-CM | POA: Diagnosis not present

## 2020-09-28 MED ORDER — PREDNISONE 20 MG PO TABS: 40.0000 mg | ORAL_TABLET | Freq: Every day | ORAL | 0 refills | Status: DC

## 2020-09-28 MED ORDER — HYDROCODONE-ACETAMINOPHEN 5-325 MG PO TABS: 1.0000 | ORAL_TABLET | Freq: Four times a day (QID) | ORAL | 0 refills | Status: DC | PRN

## 2020-09-28 NOTE — ED Triage Notes (Addendum)
Patient c/o pain on the sole of her right foot and the outer area of the right foot x 1 year. Patient states she twisted her right foot approx 2 weeks ago. Patient states that she did not have pain at that time, but began having pain 2 days. Patient states the pain is worse when she bears weight or with any movement.  Patient also mentioned that she has had intermittent swelling of the right foot in the past year aswell.

## 2020-09-28 NOTE — ED Provider Notes (Signed)
[facility]-EMERGENCY DEPT Provider Note   CSN: [id number] Arrival date & time: 09/28/20  1017     History Chief Complaint  Patient presents with   Foot Pain    Diana Mendez is a 59 y.o. female.  HPI Patient presents with right foot pain over the last year.  States that is more under the arch of the foot to start with but now more on the outside of the foot to.  Comes and goes but worse sometimes.  It is sometimes stabbing and sometimes a dull pain.  She thought it was neuropathy since she is diabetic and had not really done much about it.  Said 2 weeks ago she twisted her foot and pain has been worse since.  States been having difficulty walking due to the pain.  Has had swelling at times also.  No fevers or chills.  No relief with medicines at home.    Past Medical History:  Diagnosis Date   Anxiety    Asthma    Depression    Diabetes mellitus without complication (HCC)    High cholesterol    Hypertension     Patient Active Problem List   Diagnosis Date Noted   DM type 2 (diabetes mellitus, type 2) (HCC) 03/05/2020   Acute diverticulitis 03/04/2020   Diverticulitis 03/04/2020    Past Surgical History:  Procedure Laterality Date   ABDOMINAL HYSTERECTOMY     partial   BUNIONECTOMY     cervical disectomy     CHOLECYSTECTOMY     TUBAL LIGATION       OB History   No obstetric history on file.     Family History  Problem Relation Age of Onset   Hypertension Mother    Diabetes Mother     Social History   Tobacco Use   Smoking status: Never Smoker   Smokeless tobacco: Never Used  Vaping Use   Vaping Use: Never used  Substance Use Topics   Alcohol use: Never   Drug use: Never    Home Medications Prior to Admission medications   Medication Sig Start Date End Date Taking? Authorizing Provider  acetaminophen (TYLENOL) 500 MG tablet Take 2 tablets (1,000 mg total) by mouth 3 (three) times daily. Patient not  taking: Reported on 07/21/2020 03/08/20   Kendell Bane, MD  albuterol (VENTOLIN HFA) 108 (90 Base) MCG/ACT inhaler Inhale 1 puff into the lungs in the morning and at bedtime.    [provider]  amLODipine (NORVASC) 5 MG tablet Take 1 tablet (5 mg total) by mouth daily. 07/23/18 07/21/20  Curatolo, Adam, DO  fluticasone furoate-vilanterol (BREO ELLIPTA) 200-25 MCG/INH AEPB Inhale 1 puff into the lungs daily.    [provider]  HYDROcodone-acetaminophen (NORCO/VICODIN) 5-325 MG tablet Take 1-2 tablets by mouth every 6 (six) hours as needed. 09/28/20   Benjiman Core, MD  ibuprofen (ADVIL) 400 MG tablet Take 1 tablet (400 mg total) by mouth every 6 (six) hours as needed for moderate pain. Patient not taking: Reported on 07/21/2020 03/08/20   Kendell Bane, MD  metFORMIN (GLUCOPHAGE) 500 MG tablet Take 500 mg by mouth 2 (two) times daily with a meal.    [provider]  predniSONE (DELTASONE) 20 MG tablet Take 2 tablets (40 mg total) by mouth daily. 09/28/20   Benjiman Core, MD  rosuvastatin (CRESTOR) 10 MG tablet Take 10 mg by mouth at bedtime.    [provider]  traZODone (DESYREL) 100 MG tablet Take  100 mg by mouth at bedtime.  03/20/20   [provider]  valsartan-hydrochlorothiazide (DIOVAN-HCT) 320-25 MG tablet Take 1 tablet by mouth daily. 03/17/20   [provider]    Allergies    Flexeril [cyclobenzaprine] and Lisinopril  Review of Systems   Review of Systems  Constitutional: Negative for appetite change and fever.  Respiratory: Negative for shortness of breath.   Musculoskeletal:       Right foot pain.  Right foot swelling.  Skin: Negative for wound.  Neurological: Negative for weakness and numbness.    Physical Exam Updated Vital Signs BP (!) 151/99 (BP Location: Left Arm)    Pulse 89    Temp 98.6 F (37 C) (Oral)    Resp 16    Ht 5\' 8"  (1.727 m)    Wt 68.9 kg    SpO2 98%    BMI 23.11 kg/m   Physical  Exam Vitals and nursing note reviewed.  Musculoskeletal:     Comments: Tenderness somewhat diffusely over right foot.  Worse over plantar aspect and lateral aspect.  Some swelling over mid lateral foot.  Strong dorsalis pedis pulse.  Sensation grossly intact.  No skin lesions.  Scar from previous bunion surgery over medial aspect of foot.  No tenderness over ankle or proximally on the right lower leg.  Skin:    Capillary Refill: Capillary refill takes less than 2 seconds.  Neurological:     Mental Status: She is alert and oriented to person, place, and time.  Psychiatric:        Mood and Affect: Mood normal.     ED Results / Procedures / Treatments   Labs (all labs ordered are listed, but only abnormal results are displayed) Labs Reviewed - No data to display  EKG None  Radiology DG Foot Complete Right  Result Date: 09/28/2020 CLINICAL DATA:  Right foot pain EXAM: RIGHT FOOT COMPLETE - 3+ VIEW COMPARISON:  None. FINDINGS: Three view radiograph right foot demonstrates normal alignment. First ray bunionectomy has been performed. Joint spaces are preserved. No ankle effusion. Moderate soft tissue swelling is seen dorsal to the midfoot. IMPRESSION: Soft tissue swelling.  No acute fracture or dislocation. Electronically Signed   By: 09/30/2020 MD   On: 09/28/2020 12:10    Procedures Procedures (including critical care time)  Medications Ordered in ED Medications - No data to display  ED Course  I have reviewed the triage vital signs and the nursing notes.  Pertinent labs & imaging results that were available during my care of the patient were reviewed by me and considered in my medical decision making (see chart for details).    MDM Rules/Calculators/A&P                          Patient with foot pain.  Has had for the last year but worse the last few days.  X-ray shows only soft tissue swelling.  Potentially could have started as plantar fasciitis.  Also potentially has  foot sprain after she twisted it a couple weeks ago.  Will discharge home with postop shoe.  We will also treat with steroids and short course of narcotics.  Will have follow-up with orthopedic surgeon.  Reviewed database.  Reviewed imaging.  Potentially could have opponent of neuropathy also with the patient's diabetes.  Discussed risk benefits of the steroid particularly in the setting of her diabetes. Final Clinical Impression(s) / ED Diagnoses Final diagnoses:  Right foot  pain    Rx / DC Orders ED Discharge Orders         Ordered    predniSONE (DELTASONE) 20 MG tablet  Daily        09/28/20 1312    HYDROcodone-acetaminophen (NORCO/VICODIN) 5-325 MG tablet  Every 6 hours PRN        09/28/20 1312           Benjiman Core, MD 09/28/20 1314

## 2020-10-13 ENCOUNTER — Ambulatory Visit (HOSPITAL_COMMUNITY): Payer: Medicaid Other | Admitting: Licensed Clinical Social Worker

## 2020-10-18 ENCOUNTER — Other Ambulatory Visit: Payer: Self-pay

## 2020-10-18 ENCOUNTER — Ambulatory Visit (INDEPENDENT_AMBULATORY_CARE_PROVIDER_SITE_OTHER): Payer: Medicare (Managed Care) | Admitting: Psychiatry

## 2020-10-18 ENCOUNTER — Encounter (HOSPITAL_COMMUNITY): Payer: Self-pay | Admitting: Psychiatry

## 2020-10-18 VITALS — BP 173/97 | HR 67 | Temp 98.0°F | Ht 68.0 in | Wt 155.0 lb

## 2020-10-18 DIAGNOSIS — F330 Major depressive disorder, recurrent, mild: Secondary | ICD-10-CM | POA: Diagnosis not present

## 2020-10-18 DIAGNOSIS — F411 Generalized anxiety disorder: Secondary | ICD-10-CM

## 2020-10-18 MED ORDER — ESCITALOPRAM OXALATE 10 MG PO TABS: 10.0000 mg | ORAL_TABLET | Freq: Every day | ORAL | 2 refills | Status: DC

## 2020-10-18 MED ORDER — TRAZODONE HCL 100 MG PO TABS: 100.0000 mg | ORAL_TABLET | Freq: Every day | ORAL | 2 refills | Status: DC

## 2020-10-18 MED ORDER — HYDROXYZINE HCL 10 MG PO TABS: 10.0000 mg | ORAL_TABLET | Freq: Three times a day (TID) | ORAL | 2 refills | Status: DC | PRN

## 2020-10-18 NOTE — Progress Notes (Signed)
Psychiatric Initial Adult Assessment   Patient Identification: Diana Mendez MRN:  [id number] Date of Evaluation:  10/18/2020 Referral Source: I have been suffering from anxiety and deoression for years Chief Complaint:   Chief Complaint    Medication Management     Visit Diagnosis:    ICD-10-CM   1. Generalized anxiety disorder  F41.1 traZODone (DESYREL) 100 MG tablet    escitalopram (LEXAPRO) 10 MG tablet    hydrOXYzine (ATARAX/VISTARIL) 10 MG tablet    Ambulatory referral to Social Work  2. Mild episode of recurrent major depressive disorder (HCC)  F33.0 traZODone (DESYREL) 100 MG tablet    escitalopram (LEXAPRO) 10 MG tablet    Ambulatory referral to Social Work    History of Present Illness:  59 year old female seen today for initial psychiatric evaluation.  She walked in to the outpatient clinic for medication management.  She has a psychiatric history of anxiety, depression, cocaine use (in remission for 26 years) and bipolar disorder.  She is currently not prescribed any medications however she notes that she was once prescribed Lexapro 10 mg and trazodone 100 mg.  She notes that she has not taken months since relocating from [place] to [place].  Today she is well-groomed, pleasant, cooperative, engaged in conversation, and maintained eye contact.  During exam patient was tearful.  She describes her mood as anxious and depressed.  A GAD-7 was conducted and patient scored a 17.  A PHQ-9 was also conducted and patient scored a 10.  She notes that her depressed mood is exacerbated from not being on her medications.  Shee endorses symptoms of depression such as insomnia, psychomotor agitation, fatigue, feelings of guilt, difficulty concentrating, hopelessness, anxiety, and panic attacks that occur at least 3 times a week.  She notes that when she has panic attacks she goes into a bathroom rocks and cries.  Today she denies SI/HI/VH or paranoia.  Patient notes that in the  past she was diagnosed with bipolar 2 disorder however notes that she has never been on a mood stabilizer.  She notes at times her mood fluctuates and she is irritable however denies other symptoms of bipolar disorder.  She notes that to deal with her irritability she likes to be alone and avoids crowds because it makes her anxious.  Patient notes that she has experienced trauma due to separating from her husband.  She notes that she left her husband for 3 months to visit her children after their father had died and notes that he felt abandoned and left the relationship.  She also notes that for the first time she alone and reports that this is bothersome.  She notes that she has 8 children however informed provider that she is not close to them because they were placed into foster care because of her substance use.  She notes that she has been sober from cocaine for over 26 years and notes she has no desire to go back.  She denies alcohol or tobacco use.  She informed provider that she currently works at [employer].  She notes that she has been working there for 3 weeks and finds enjoyment in it however noted that she becomes anxious around certain people.  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  She informed provider that she is proud of herself because she was able to pay her rent by herself for  the first time.  She notes that she would like assistance with future payments and potentially better housing.  Patient referred to care management for further assistance.  Patient is agreeable to restarting Lexapro 10 mg to help manage symptoms of anxiety and depression.  She is also agreeable to restarting trazodone 100 mg to help manage sleep.  She will also start hydroxyzine 10 mg 3 times daily as needed for anxiety. Potential side effects of medication and risks vs benefits of treatment vs  non-treatment were explained and discussed. All questions were answered.  We will follow up with outpatient counseling for therapy.  Patient also informed provider that she has had right foot pain.  Provider informed patient about community health and wellness primary care services.  She endorsed understanding and was grateful for the resource.She will continue all of her other medications as prescribed.  No other concerns noted at this time.  Associated Signs/Symptoms: Depression Symptoms:  depressed mood, insomnia, psychomotor agitation, fatigue, feelings of worthlessness/guilt, difficulty concentrating, hopelessness, impaired memory, anxiety, panic attacks, loss of energy/fatigue, (Hypo) Manic Symptoms:  Elevated Mood, Irritable Mood, Anxiety Symptoms:  Excessive Worry, Panic Symptoms, Psychotic Symptoms:  Denies PTSD Symptoms: Had a traumatic exposure:  Patient notes that she lost her children to CPS custody due to her substance use.  She also notes that she and her husband and is no longer together reports that that was traumatic.  Past Psychiatric History: Anxiety, bipolar 2,Cocaine use (in remission for 26 years) and depression,   Previous Psychotropic Medications: Lexapro and trazodone  Substance Abuse History in the last 12 months:  No.  Consequences of Substance Abuse: NA  Past Medical History:  Past Medical History:  Diagnosis Date  . Anxiety   . Asthma   . Depression   . Diabetes mellitus without complication (HCC)   . High cholesterol   . Hypertension     Past Surgical History:  Procedure Laterality Date  . ABDOMINAL HYSTERECTOMY     partial  . BUNIONECTOMY    . cervical disectomy    . CHOLECYSTECTOMY    . TUBAL LIGATION      Family Psychiatric History: Denies  Family History:  Family History  Problem Relation Age of Onset  . Hypertension Mother   . Diabetes Mother     Social History:   Social History   Socioeconomic History  . Marital  status: Single    Spouse name: Not on file  . Number of children: Not on file  . Years of education: Not on file  . Highest education level: Not on file  Occupational History  . Not on file  Tobacco Use  . Smoking status: Never Smoker  . Smokeless tobacco: Never Used  Vaping Use  . Vaping Use: Never used  Substance and Sexual Activity  . Alcohol use: Never  . Drug use: Never  . Sexual activity: Not on file  Other Topics Concern  . Not on file  Social History Narrative  . Not on file   Social Determinants of Health   Financial Resource Strain:   . Difficulty of Paying Living Expenses: Not on file  Food Insecurity:   . Worried About Programme researcher, broadcasting/film/video in the Last Year: Not on file  . Ran Out of Food in the Last Year: Not on file  Transportation Needs:   . Lack of Transportation (Medical): Not on file  . Lack of Transportation (Non-Medical): Not on file  Physical Activity:   . Days of Exercise  per Week: Not on file  . Minutes of Exercise per Session: Not on file  Stress:   . Feeling of Stress : Not on file  Social Connections:   . Frequency of Communication with Friends and Family: Not on file  . Frequency of Social Gatherings with Friends and Family: Not on file  . Attends Religious Services: Not on file  . Active Member of Clubs or Organizations: Not on file  . Attends Banker Meetings: Not on file  . Marital Status: Not on file    Additional Social History: Patient resides in [place].  He is separated and has 8 children.  She denies tobacco, alcohol, or illegal drug use.  She notes that she has been sober from cocaine for 26 years.  She currently works at [employer]. Allergies:   Allergies  Allergen Reactions  . Flexeril [Cyclobenzaprine] Anaphylaxis  . Lisinopril Diarrhea    Metabolic Disorder Labs: Lab Results  Component Value Date   HGBA1C 6.8 (H) 03/05/2020   MPG 148.46 03/05/2020   No results found for: PROLACTIN Lab Results  Component  Value Date   CHOL 193 03/05/2020   TRIG 118 03/05/2020   HDL 48 03/05/2020   CHOLHDL 4.0 03/05/2020   VLDL 24 03/05/2020   LDLCALC 121 (H) 03/05/2020   No results found for: TSH  Therapeutic Level Labs: No results found for: LITHIUM No results found for: CBMZ No results found for: VALPROATE  Current Medications: Current Outpatient Medications  Medication Sig Dispense Refill  . acetaminophen (TYLENOL) 500 MG tablet Take 2 tablets (1,000 mg total) by mouth 3 (three) times daily. (Patient not taking: Reported on 07/21/2020) 30 tablet 0  . albuterol (VENTOLIN HFA) 108 (90 Base) MCG/ACT inhaler Inhale 1 puff into the lungs in the morning and at bedtime.    Marland Kitchen amLODipine (NORVASC) 5 MG tablet Take 1 tablet (5 mg total) by mouth daily. 30 tablet 0  . escitalopram (LEXAPRO) 10 MG tablet Take 1 tablet (10 mg total) by mouth daily. 30 tablet 2  . fluticasone furoate-vilanterol (BREO ELLIPTA) 200-25 MCG/INH AEPB Inhale 1 puff into the lungs daily.    Marland Kitchen HYDROcodone-acetaminophen (NORCO/VICODIN) 5-325 MG tablet Take 1-2 tablets by mouth every 6 (six) hours as needed. 6 tablet 0  . hydrOXYzine (ATARAX/VISTARIL) 10 MG tablet Take 1 tablet (10 mg total) by mouth 3 (three) times daily as needed. 90 tablet 2  . ibuprofen (ADVIL) 400 MG tablet Take 1 tablet (400 mg total) by mouth every 6 (six) hours as needed for moderate pain. (Patient not taking: Reported on 07/21/2020) 30 tablet 0  . metFORMIN (GLUCOPHAGE) 500 MG tablet Take 500 mg by mouth 2 (two) times daily with a meal.    . predniSONE (DELTASONE) 20 MG tablet Take 2 tablets (40 mg total) by mouth daily. 6 tablet 0  . rosuvastatin (CRESTOR) 10 MG tablet Take 10 mg by mouth at bedtime.    . traZODone (DESYREL) 100 MG tablet Take 1 tablet (100 mg total) by mouth at bedtime. 30 tablet 2  . valsartan-hydrochlorothiazide (DIOVAN-HCT) 320-25 MG tablet Take 1 tablet by mouth daily.     No current facility-administered medications for this visit.     Musculoskeletal: Strength & Muscle Tone: within normal limits Gait & Station: normal Patient leans: N/A  Psychiatric Specialty Exam: Review of Systems  Blood pressure (!) 173/97, pulse 67, temperature 98 F (36.7 C), temperature source Oral, height 5\' 8"  (1.727 m), weight 155 lb (70.3 kg), SpO2 100 %.Body mass  index is 23.57 kg/m.  General Appearance: Well Groomed  Eye Contact:  Good  Speech:  Clear and Coherent and Normal Rate  Volume:  Normal  Mood:  Anxious and Depressed  Affect:  Appropriate and Congruent  Thought Process:  Coherent, Goal Directed and Linear  Orientation:  Full (Time, Place, and Person)  Thought Content:  WDL and Logical  Suicidal Thoughts:  No  Homicidal Thoughts:  No  Memory:  Immediate;   Good Recent;   Good Remote;   Good  Judgement:  Good  Insight:  Good  Psychomotor Activity:  Normal  Concentration:  Concentration: Fair and Attention Span: Fair  Recall:  FiservFair  Fund of Knowledge:Good  Language: Good  Akathisia:  No  Handed:  Right  AIMS (if indicated):  Not done  Assets:  Communication Skills Desire for Improvement Financial Resources/Insurance Housing  ADL's:  Intact  Cognition: WNL  Sleep:  Fair   Screenings: GAD-7     [facility]  Total GAD-7 Score 17    PHQ2-9     Clinical [facility]  PHQ-2 Total Score 2  PHQ-9 Total Score 10      Assessment and Plan: Patient endorses symptoms of anxiety and depression. She is agreeable to restarting Lexapro 10 mg to help manage symptoms of anxiety and depression.  She is also agreeable to restarting trazodone 100 mg to help manage sleep.  She will also start hydroxyzine 10 mg 3 times daily as needed for anxiety.   1. Generalized anxiety disorder  Restart- traZODone (DESYREL) 100 MG tablet; Take 1 tablet (100 mg total) by mouth at bedtime.  Dispense: 30 tablet; Refill:  2 Restart-- escitalopram (LEXAPRO) 10 MG tablet; Take 1 tablet (10 mg total) by mouth daily.  Dispense: 30 tablet; Refill: 2 Start- hydrOXYzine (ATARAX/VISTARIL) 10 MG tablet; Take 1 tablet (10 mg total) by mouth 3 (three) times daily as needed.  Dispense: 90 tablet; Refill: 2 - Ambulatory referral to Social Work  2. Mild episode of recurrent major depressive disorder (HCC)  Restart-- traZODone (DESYREL) 100 MG tablet; Take 1 tablet (100 mg total) by mouth at bedtime.  Dispense: 30 tablet; Refill: 2 Restart-- escitalopram (LEXAPRO) 10 MG tablet; Take 1 tablet (10 mg total) by mouth daily.  Dispense: 30 tablet; Refill: 2 - Ambulatory referral to Social Work  Follow up in 1 months Follow up with threapy Shanna CiscoBrittney E Alajah Witman, NP 11/10/202111:29 AM

## 2020-11-04 ENCOUNTER — Emergency Department (HOSPITAL_COMMUNITY)
Admission: EM | Admit: 2020-11-04 | Discharge: 2020-11-04 | Disposition: A | Discharge status: Home / Self Care | Payer: Medicare (Managed Care) | Attending: Emergency Medicine | Admitting: Emergency Medicine

## 2020-11-04 ENCOUNTER — Other Ambulatory Visit: Payer: Self-pay

## 2020-11-04 DIAGNOSIS — M79671 Pain in right foot: Secondary | ICD-10-CM | POA: Diagnosis present

## 2020-11-04 DIAGNOSIS — M722 Plantar fascial fibromatosis: Secondary | ICD-10-CM

## 2020-11-04 DIAGNOSIS — Z7984 Long term (current) use of oral hypoglycemic drugs: Secondary | ICD-10-CM | POA: Insufficient documentation

## 2020-11-04 DIAGNOSIS — E119 Type 2 diabetes mellitus without complications: Secondary | ICD-10-CM | POA: Insufficient documentation

## 2020-11-04 DIAGNOSIS — J45909 Unspecified asthma, uncomplicated: Secondary | ICD-10-CM | POA: Insufficient documentation

## 2020-11-04 DIAGNOSIS — I10 Essential (primary) hypertension: Secondary | ICD-10-CM | POA: Diagnosis not present

## 2020-11-04 DIAGNOSIS — Z7951 Long term (current) use of inhaled steroids: Secondary | ICD-10-CM | POA: Diagnosis not present

## 2020-11-04 DIAGNOSIS — Z79899 Other long term (current) drug therapy: Secondary | ICD-10-CM | POA: Diagnosis not present

## 2020-11-04 MED ORDER — PREDNISONE 20 MG PO TABS: 20.0000 mg | ORAL_TABLET | Freq: Every day | ORAL | 0 refills | Status: AC

## 2020-11-04 NOTE — ED Triage Notes (Signed)
Patient reports she developed right foot pain in October. Was evaluated in ED, no findings on XRAY. Patient sent home with steroids and referrals to therapy and specialist. Patient states she has to wait until January for new patient appointment with specialist, and unable to get a hold of therapy. Patient says her right foot is still hurting her, pain rated 10/10, aching in nature. Patient states she needs to find out what is wrong with her foot, she has missed too much work and will be fired. Patient has ortho boot on in triage.

## 2020-11-04 NOTE — ED Provider Notes (Signed)
[facility]-EMERGENCY DEPT Provider Note   CSN: [id number] Arrival date & time: 11/04/20  1217     History Chief Complaint  Patient presents with   right foot pain    Diana Mendez is a 59 y.o. female.  HPI   Patient with significant medical history of depression, diabetes presents to the emergency department with chief complaint of right heel pain.  Patient endorses that she has had right heel pain for last couple months but over the last 30 days the pain has increasing gotten worse.  She endorses severe stabbing-like pain in her right heel and feels it on the lateral aspect of her foot.  She denies recent falls, recent traumas, denies IV drug use.  She was seen here in the emergency department 1 month ago for same complaint diagnosed with possible muscular strain versus neuropathy versus plantar fasciitis started on prednisone and Percocet.  Patient states this helped for a little bit but the pain came right back.  She endorses that walking on her foot seems to make the pain a lot worse.  She states resting it and stretch it tends to make it better.  She denies autoimmune diseases or recent bug bites.  Patient denies headaches, fevers, chills, shortness of breath, chest pain, abdominal pain, nausea, vomiting, diarrhea, pedal edema.  Past Medical History:  Diagnosis Date   Anxiety    Asthma    Depression    Diabetes mellitus without complication (HCC)    High cholesterol    Hypertension     Patient Active Problem List   Diagnosis Date Noted   Generalized anxiety disorder 10/18/2020   Mild episode of recurrent major depressive disorder (HCC) 10/18/2020   DM type 2 (diabetes mellitus, type 2) (HCC) 03/05/2020   Acute diverticulitis 03/04/2020   Diverticulitis 03/04/2020    Past Surgical History:  Procedure Laterality Date   ABDOMINAL HYSTERECTOMY     partial   BUNIONECTOMY     cervical disectomy     CHOLECYSTECTOMY     TUBAL  LIGATION       OB History   No obstetric history on file.     Family History  Problem Relation Age of Onset   Hypertension Mother    Diabetes Mother     Social History   Tobacco Use   Smoking status: Never Smoker   Smokeless tobacco: Never Used  Vaping Use   Vaping Use: Never used  Substance Use Topics   Alcohol use: Never   Drug use: Never    Home Medications Prior to Admission medications   Medication Sig Start Date End Date Taking? Authorizing Provider  acetaminophen (TYLENOL) 500 MG tablet Take 2 tablets (1,000 mg total) by mouth 3 (three) times daily. Patient not taking: Reported on 07/21/2020 03/08/20   Kendell Bane, MD  albuterol (VENTOLIN HFA) 108 (90 Base) MCG/ACT inhaler Inhale 1 puff into the lungs in the morning and at bedtime.    [provider]  amLODipine (NORVASC) 5 MG tablet Take 1 tablet (5 mg total) by mouth daily. 07/23/18 07/21/20  Curatolo, Adam, DO  escitalopram (LEXAPRO) 10 MG tablet Take 1 tablet (10 mg total) by mouth daily. 10/18/20   Toy Cookey E, NP  fluticasone furoate-vilanterol (BREO ELLIPTA) 200-25 MCG/INH AEPB Inhale 1 puff into the lungs daily.    [provider]  HYDROcodone-acetaminophen (NORCO/VICODIN) 5-325 MG tablet Take 1-2 tablets by mouth every 6 (six) hours as needed. 09/28/20   Benjiman Core, MD  hydrOXYzine (  ATARAX/VISTARIL) 10 MG tablet Take 1 tablet (10 mg total) by mouth 3 (three) times daily as needed. 10/18/20   Shanna Cisco, NP  ibuprofen (ADVIL) 400 MG tablet Take 1 tablet (400 mg total) by mouth every 6 (six) hours as needed for moderate pain. Patient not taking: Reported on 07/21/2020 03/08/20   Kendell Bane, MD  metFORMIN (GLUCOPHAGE) 500 MG tablet Take 500 mg by mouth 2 (two) times daily with a meal.    [provider]  predniSONE (DELTASONE) 20 MG tablet Take 2 tablets (40 mg total) by mouth daily. 09/28/20   Benjiman Core, MD  predniSONE (DELTASONE) 20 MG  tablet Take 1 tablet (20 mg total) by mouth daily for 5 days. 11/04/20 11/09/20  Carroll Sage, PA-C  rosuvastatin (CRESTOR) 10 MG tablet Take 10 mg by mouth at bedtime.    [provider]  traZODone (DESYREL) 100 MG tablet Take 1 tablet (100 mg total) by mouth at bedtime. 10/18/20   Shanna Cisco, NP  valsartan-hydrochlorothiazide (DIOVAN-HCT) 320-25 MG tablet Take 1 tablet by mouth daily. 03/17/20   [provider]    Allergies    Flexeril [cyclobenzaprine] and Lisinopril  Review of Systems   Review of Systems  Constitutional: Negative for chills and fever.  HENT: Negative for congestion.   Respiratory: Negative for shortness of breath.   Cardiovascular: Negative for chest pain.  Gastrointestinal: Negative for abdominal pain, diarrhea, nausea and vomiting.  Genitourinary: Negative for enuresis and flank pain.  Musculoskeletal: Negative for back pain.       Right heel pain.  Skin: Negative for rash.  Neurological: Negative for dizziness.  Hematological: Does not bruise/bleed easily.    Physical Exam Updated Vital Signs BP (!) 163/88 (BP Location: Right Arm)    Pulse 60    Temp 98.5 F (36.9 C) (Oral)    Resp 18    Ht 5\' 8"  (1.727 m)    Wt 69.4 kg    SpO2 100%    BMI 23.26 kg/m   Physical Exam Vitals and nursing note reviewed.  Constitutional:      General: She is not in acute distress.    Appearance: She is not ill-appearing.  HENT:     Head: Normocephalic and atraumatic.     Nose: No congestion.  Eyes:     Conjunctiva/sclera: Conjunctivae normal.  Cardiovascular:     Rate and Rhythm: Normal rate.  Pulmonary:     Effort: Pulmonary effort is normal.  Musculoskeletal:        General: Tenderness present. No swelling.     Right lower leg: No edema.     Comments: Patient's right foot was visualized there is no erythema, edema, ecchymosis, lacerations or abrasions noted.  She had full range of motion at her ankle and toes, neurovascular was fully  intact.  She had tenderness to palpation along the dorsal aspect of her heel and lateral aspect.  There is no crepitus or deformities felt.  Skin:    General: Skin is warm and dry.     Findings: No lesion or rash.     Comments: Patient's joints were visualized there is no erythema, edema, track marks or other gross abnormalities noted.  Neurological:     Mental Status: She is alert.     Comments: Patient had no difficulty with word finding.  Psychiatric:        Mood and Affect: Mood normal.     ED Results / Procedures / Treatments  Labs (all labs ordered are listed, but only abnormal results are displayed) Labs Reviewed - No data to display  EKG None  Radiology No results found.  Procedures Procedures (including critical care time)  Medications Ordered in ED Medications - No data to display  ED Course  I have reviewed the triage vital signs and the nursing notes.  Pertinent labs & imaging results that were available during my care of the patient were reviewed by me and considered in my medical decision making (see chart for details).    MDM Rules/Calculators/A&P                          Patient presents with right heel pain.  She was alert, does not appear in acute distress, vital signs reassuring.  Due to well-appearing patient, benign physical exam further lab work and imaging were not warranted.  I have low suspicion for septic arthritis as patient denies IV drug use, skin exam was performed no erythematous, edematous, warm joints noted on exam. Low suspicion for fracture or dislocation as patient denies any significant trauma to the area, no crepitus or deformities felt during palpation.  Will defer imaging at this time as there is no indication.  Low suspicion for ligament or tendon damage as area was palpated no gross defects noted, they had full range of motion at her ankles and toes.  Low suspicion for compartment syndrome as area was palpated it was soft to the  touch, neurovascular fully intact.  I suspect patient suffering from plantar fasciitis, will start her on steroids recommend NSAIDs, provide stretches and have her follow-up with Ortho or podiatry.  Vital signs have remained stable, no indication for hospital admission.   Patient given at home care as well strict return precautions.  Patient verbalized that they understood agreed to said plan.   Final Clinical Impression(s) / ED Diagnoses Final diagnoses:  Plantar fasciitis of right foot    Rx / DC Orders ED Discharge Orders         Ordered    predniSONE (DELTASONE) 20 MG tablet  Daily        11/04/20 1303           Carroll Sage, PA-C 11/04/20 1324    Bethann Berkshire, MD 11/05/20 0725

## 2020-11-04 NOTE — Discharge Instructions (Addendum)
You have been seen here for right heel pain.  Started you on steroids please take as prescribed. I recommend taking over-the-counter pain medications like ibuprofen and/or Tylenol every 6 as needed.  Please follow dosage and on the back of bottle.  I also recommend applying ice to the area and stretching out the muscles as this will help decrease stiffness and pain.  I have given you information on exercises please follow.  Given you information for podiatry and orthopedics please call to schedule a appointment.  Come back to the emergency department if you develop chest pain, shortness of breath, severe abdominal pain, uncontrolled nausea, vomiting, diarrhea.

## 2020-11-10 ENCOUNTER — Emergency Department (HOSPITAL_COMMUNITY)
Admission: EM | Admit: 2020-11-10 | Discharge: 2020-11-10 | Discharge status: Against Medical Advice | Payer: Medicare (Managed Care) | Attending: Emergency Medicine | Admitting: Emergency Medicine

## 2020-11-10 ENCOUNTER — Encounter (HOSPITAL_COMMUNITY): Payer: Self-pay

## 2020-11-10 ENCOUNTER — Other Ambulatory Visit: Payer: Self-pay

## 2020-11-10 DIAGNOSIS — R059 Cough, unspecified: Secondary | ICD-10-CM | POA: Insufficient documentation

## 2020-11-10 DIAGNOSIS — J029 Acute pharyngitis, unspecified: Secondary | ICD-10-CM | POA: Diagnosis present

## 2020-11-10 DIAGNOSIS — Z5321 Procedure and treatment not carried out due to patient leaving prior to being seen by health care provider: Secondary | ICD-10-CM | POA: Insufficient documentation

## 2020-11-10 NOTE — ED Notes (Signed)
Patient reports she needs to leave to catch the bus.

## 2020-11-10 NOTE — ED Triage Notes (Signed)
Pt reports sore throat and cough that started 2 days ago.

## 2020-11-12 ENCOUNTER — Emergency Department (HOSPITAL_COMMUNITY)
Admission: EM | Admit: 2020-11-12 | Discharge: 2020-11-12 | Disposition: A | Discharge status: Home / Self Care | Payer: Medicare (Managed Care) | Attending: Emergency Medicine | Admitting: Emergency Medicine

## 2020-11-12 ENCOUNTER — Other Ambulatory Visit: Payer: Self-pay

## 2020-11-12 ENCOUNTER — Emergency Department (HOSPITAL_COMMUNITY): Payer: Medicare (Managed Care)

## 2020-11-12 ENCOUNTER — Encounter (HOSPITAL_COMMUNITY): Payer: Self-pay | Admitting: Obstetrics and Gynecology

## 2020-11-12 DIAGNOSIS — Z20822 Contact with and (suspected) exposure to covid-19: Secondary | ICD-10-CM | POA: Insufficient documentation

## 2020-11-12 DIAGNOSIS — J45909 Unspecified asthma, uncomplicated: Secondary | ICD-10-CM | POA: Insufficient documentation

## 2020-11-12 DIAGNOSIS — B349 Viral infection, unspecified: Secondary | ICD-10-CM

## 2020-11-12 DIAGNOSIS — Z79899 Other long term (current) drug therapy: Secondary | ICD-10-CM | POA: Insufficient documentation

## 2020-11-12 DIAGNOSIS — E1169 Type 2 diabetes mellitus with other specified complication: Secondary | ICD-10-CM | POA: Insufficient documentation

## 2020-11-12 DIAGNOSIS — E785 Hyperlipidemia, unspecified: Secondary | ICD-10-CM | POA: Insufficient documentation

## 2020-11-12 DIAGNOSIS — Z7984 Long term (current) use of oral hypoglycemic drugs: Secondary | ICD-10-CM | POA: Diagnosis not present

## 2020-11-12 DIAGNOSIS — R509 Fever, unspecified: Secondary | ICD-10-CM | POA: Diagnosis present

## 2020-11-12 LAB — CBC WITH DIFFERENTIAL/PLATELET
Abs Immature Granulocytes: 0.03 10*3/uL (ref 0.00–0.07)
Basophils Absolute: 0 10*3/uL (ref 0.0–0.1)
Basophils Relative: 1 %
Eosinophils Absolute: 0.2 10*3/uL (ref 0.0–0.5)
Eosinophils Relative: 4 %
HCT: 39.8 % (ref 36.0–46.0)
Hemoglobin: 12.8 g/dL (ref 12.0–15.0)
Immature Granulocytes: 1 %
Lymphocytes Relative: 29 %
Lymphs Abs: 1.3 10*3/uL (ref 0.7–4.0)
MCH: 27.8 pg (ref 26.0–34.0)
MCHC: 32.2 g/dL (ref 30.0–36.0)
MCV: 86.3 fL (ref 80.0–100.0)
Monocytes Absolute: 0.8 10*3/uL (ref 0.1–1.0)
Monocytes Relative: 17 %
Neutro Abs: 2.2 10*3/uL (ref 1.7–7.7)
Neutrophils Relative %: 48 %
Platelets: 260 10*3/uL (ref 150–400)
RBC: 4.61 MIL/uL (ref 3.87–5.11)
RDW: 14.3 % (ref 11.5–15.5)
WBC: 4.5 10*3/uL (ref 4.0–10.5)
nRBC: 0 % (ref 0.0–0.2)

## 2020-11-12 LAB — BASIC METABOLIC PANEL
Anion gap: 8 (ref 5–15)
BUN: 16 mg/dL (ref 8–23)
CO2: 24 mmol/L (ref 22–32)
Calcium: 8.5 mg/dL — ABNORMAL LOW (ref 8.9–10.3)
Chloride: 104 mmol/L (ref 98–111)
Creatinine, Ser: 0.83 mg/dL (ref 0.44–1.00)
GFR, Estimated: >60 mL/min (ref >60)
Glucose, Bld: 116 mg/dL — ABNORMAL HIGH (ref 70–99)
Potassium: 3.7 mmol/L (ref 3.5–5.1)
Sodium: 136 mmol/L (ref 135–145)

## 2020-11-12 LAB — URINALYSIS, ROUTINE W REFLEX MICROSCOPIC
Bacteria, UA: NONE SEEN
Bilirubin Urine: NEGATIVE
Glucose, UA: NEGATIVE mg/dL
Ketones, ur: NEGATIVE mg/dL
Leukocytes,Ua: NEGATIVE
Nitrite: NEGATIVE
Protein, ur: NEGATIVE mg/dL
RBC / HPF: >50 RBC/hpf — ABNORMAL HIGH (ref 0–5)
Specific Gravity, Urine: 1.02 (ref 1.005–1.030)
pH: 5 (ref 5.0–8.0)

## 2020-11-12 LAB — RESP PANEL BY RT-PCR (FLU A&B, COVID) ARPGX2
Influenza A by PCR: NEGATIVE
Influenza B by PCR: NEGATIVE
SARS Coronavirus 2 by RT PCR: NEGATIVE

## 2020-11-12 MED ORDER — ACETAMINOPHEN 325 MG PO TABS
650.0000 mg | ORAL_TABLET | Freq: Once | ORAL | Status: AC
  Administered 2020-11-12: 650 mg via ORAL
  Filled 2020-11-12: qty 2

## 2020-11-12 MED ORDER — SODIUM CHLORIDE 0.9 % IV BOLUS
1000.0000 mL | Freq: Once | INTRAVENOUS | Status: AC
  Administered 2020-11-12: 1000 mL via INTRAVENOUS

## 2020-11-12 MED ORDER — SODIUM CHLORIDE 0.9 % IV SOLN: INTRAVENOUS | Status: DC

## 2020-11-12 MED ORDER — KETOROLAC TROMETHAMINE 30 MG/ML IJ SOLN
15.0000 mg | Freq: Once | INTRAMUSCULAR | Status: AC
  Administered 2020-11-12: 15 mg via INTRAVENOUS
  Filled 2020-11-12: qty 1

## 2020-11-12 NOTE — ED Provider Notes (Signed)
[facility]-EMERGENCY DEPT Provider Note   CSN: [id number] Arrival date & time: 11/12/20  3151     History Chief Complaint  Patient presents with  . Fever  . Generalized Body Aches    Diana Mendez is a 59 y.o. female.  59 year old female presents with 4 days of cough and congestion as well as watery diarrhea and nonbilious emesis.  States that she has been short of breath and has been febrile at home.  Denies any urinary symptoms.  Patient states that she completed her Covid vaccination approximately 5 months ago.  Patient did come to the ED for treatment but then left without being seen.  States she has been using over-the-counter medications without relief.        Past Medical History:  Diagnosis Date  . Anxiety   . Asthma   . Depression   . Diabetes mellitus without complication (HCC)   . High cholesterol   . Hypertension     Patient Active Problem List   Diagnosis Date Noted  . Generalized anxiety disorder 10/18/2020  . Mild episode of recurrent major depressive disorder (HCC) 10/18/2020  . DM type 2 (diabetes mellitus, type 2) (HCC) 03/05/2020  . Acute diverticulitis 03/04/2020  . Diverticulitis 03/04/2020    Past Surgical History:  Procedure Laterality Date  . ABDOMINAL HYSTERECTOMY     partial  . BUNIONECTOMY    . cervical disectomy    . CHOLECYSTECTOMY    . TUBAL LIGATION       OB History   No obstetric history on file.     Family History  Problem Relation Age of Onset  . Hypertension Mother   . Diabetes Mother     Social History   Tobacco Use  . Smoking status: Never Smoker  . Smokeless tobacco: Never Used  Vaping Use  . Vaping Use: Never used  Substance Use Topics  . Alcohol use: Never  . Drug use: Never    Home Medications Prior to Admission medications   Medication Sig Start Date End Date Taking? Authorizing Provider  acetaminophen (TYLENOL) 500 MG tablet Take 2 tablets (1,000 mg total) by mouth 3  (three) times daily. Patient not taking: Reported on 07/21/2020 03/08/20   Kendell Bane, MD  albuterol (VENTOLIN HFA) 108 (90 Base) MCG/ACT inhaler Inhale 1 puff into the lungs in the morning and at bedtime.    [provider]  amLODipine (NORVASC) 5 MG tablet Take 1 tablet (5 mg total) by mouth daily. 07/23/18 07/21/20  Curatolo, Adam, DO  escitalopram (LEXAPRO) 10 MG tablet Take 1 tablet (10 mg total) by mouth daily. 10/18/20   Toy Cookey E, NP  fluticasone furoate-vilanterol (BREO ELLIPTA) 200-25 MCG/INH AEPB Inhale 1 puff into the lungs daily.    [provider]  HYDROcodone-acetaminophen (NORCO/VICODIN) 5-325 MG tablet Take 1-2 tablets by mouth every 6 (six) hours as needed. 09/28/20   Benjiman Core, MD  hydrOXYzine (ATARAX/VISTARIL) 10 MG tablet Take 1 tablet (10 mg total) by mouth 3 (three) times daily as needed. 10/18/20   Shanna Cisco, NP  ibuprofen (ADVIL) 400 MG tablet Take 1 tablet (400 mg total) by mouth every 6 (six) hours as needed for moderate pain. Patient not taking: Reported on 07/21/2020 03/08/20   Kendell Bane, MD  metFORMIN (GLUCOPHAGE) 500 MG tablet Take 500 mg by mouth 2 (two) times daily with a meal.    [provider]  predniSONE (DELTASONE) 20 MG tablet Take 2 tablets (40 mg  total) by mouth daily. 09/28/20   Benjiman Core, MD  rosuvastatin (CRESTOR) 10 MG tablet Take 10 mg by mouth at bedtime.    [provider]  traZODone (DESYREL) 100 MG tablet Take 1 tablet (100 mg total) by mouth at bedtime. 10/18/20   Shanna Cisco, NP  valsartan-hydrochlorothiazide (DIOVAN-HCT) 320-25 MG tablet Take 1 tablet by mouth daily. 03/17/20   [provider]    Allergies    Flexeril [cyclobenzaprine] and Lisinopril  Review of Systems   Review of Systems  All other systems reviewed and are negative.   Physical Exam Updated Vital Signs BP (!) 142/107   Pulse 98   SpO2 100%   Physical Exam Vitals and  nursing note reviewed.  Constitutional:      General: She is not in acute distress.    Appearance: Normal appearance. She is well-developed. She is not toxic-appearing.  HENT:     Head: Normocephalic and atraumatic.  Eyes:     General: Lids are normal.     Conjunctiva/sclera: Conjunctivae normal.     Pupils: Pupils are equal, round, and reactive to light.  Neck:     Thyroid: No thyroid mass.     Trachea: No tracheal deviation.  Cardiovascular:     Rate and Rhythm: Normal rate and regular rhythm.     Heart sounds: Normal heart sounds. No murmur heard.  No gallop.   Pulmonary:     Effort: Pulmonary effort is normal. No respiratory distress.     Breath sounds: Normal breath sounds. No stridor. No decreased breath sounds, wheezing, rhonchi or rales.  Abdominal:     General: Bowel sounds are normal. There is no distension.     Palpations: Abdomen is soft.     Tenderness: There is no abdominal tenderness. There is no rebound.  Musculoskeletal:        General: No tenderness. Normal range of motion.     Cervical back: Normal range of motion and neck supple.  Skin:    General: Skin is warm and dry.     Findings: No abrasion or rash.  Neurological:     Mental Status: She is alert and oriented to person, place, and time.     GCS: GCS eye subscore is 4. GCS verbal subscore is 5. GCS motor subscore is 6.     Cranial Nerves: No cranial nerve deficit.     Sensory: No sensory deficit.  Psychiatric:        Speech: Speech normal.        Behavior: Behavior normal.     ED Results / Procedures / Treatments   Labs (all labs ordered are listed, but only abnormal results are displayed) Labs Reviewed  URINE CULTURE  RESP PANEL BY RT-PCR (FLU A&B, COVID) ARPGX2  CBC WITH DIFFERENTIAL/PLATELET  BASIC METABOLIC PANEL  URINALYSIS, ROUTINE W REFLEX MICROSCOPIC    EKG None  Radiology No results found.  Procedures Procedures (including critical care time)  Medications Ordered in  ED Medications  sodium chloride 0.9 % bolus 1,000 mL (has no administration in time range)  0.9 %  sodium chloride infusion (has no administration in time range)    ED Course  I have reviewed the triage vital signs and the nursing notes.  Pertinent labs & imaging results that were available during my care of the patient were reviewed by me and considered in my medical decision making (see chart for details).    MDM Rules/Calculators/A&P  Chest x-ray without acute findings Patient presented with URI type symptoms and possible Covid or flu.  Covid test and flu test negative.  Urinalysis without infection.  Electrolytes and CBC within normal limits.  Given IV fluids as well as Toradol here.  Suspect viral process and will discharge Final Clinical Impression(s) / ED Diagnoses Final diagnoses:  None    Rx / DC Orders ED Discharge Orders    None       Lorre Nick, MD 11/12/20 1255

## 2020-11-12 NOTE — Discharge Instructions (Signed)
Use Tylenol and/or Motrin as directed for your body aches.

## 2020-11-12 NOTE — ED Triage Notes (Signed)
Patient reports to the ER for fever (reports 103 at home), cough, and body aches. Patient reports she was seen here 2 days ago but had to leave to catch the bus. Patient states her throat hurts and she feels overall weak.

## 2020-11-13 LAB — URINE CULTURE: Culture: <10000 — AB

## 2020-11-20 ENCOUNTER — Emergency Department (HOSPITAL_COMMUNITY)
Admission: EM | Admit: 2020-11-20 | Discharge: 2020-11-20 | Disposition: A | Discharge status: Home / Self Care | Payer: Medicare (Managed Care) | Attending: Emergency Medicine | Admitting: Emergency Medicine

## 2020-11-20 ENCOUNTER — Other Ambulatory Visit: Payer: Self-pay

## 2020-11-20 ENCOUNTER — Emergency Department (HOSPITAL_COMMUNITY): Payer: Medicare (Managed Care)

## 2020-11-20 DIAGNOSIS — R0789 Other chest pain: Secondary | ICD-10-CM | POA: Diagnosis not present

## 2020-11-20 DIAGNOSIS — Z79899 Other long term (current) drug therapy: Secondary | ICD-10-CM | POA: Diagnosis not present

## 2020-11-20 DIAGNOSIS — J45909 Unspecified asthma, uncomplicated: Secondary | ICD-10-CM | POA: Insufficient documentation

## 2020-11-20 DIAGNOSIS — R059 Cough, unspecified: Secondary | ICD-10-CM

## 2020-11-20 DIAGNOSIS — E119 Type 2 diabetes mellitus without complications: Secondary | ICD-10-CM | POA: Diagnosis not present

## 2020-11-20 DIAGNOSIS — Z7951 Long term (current) use of inhaled steroids: Secondary | ICD-10-CM | POA: Diagnosis not present

## 2020-11-20 DIAGNOSIS — Z7984 Long term (current) use of oral hypoglycemic drugs: Secondary | ICD-10-CM | POA: Insufficient documentation

## 2020-11-20 DIAGNOSIS — I10 Essential (primary) hypertension: Secondary | ICD-10-CM | POA: Diagnosis not present

## 2020-11-20 MED ORDER — HYDROCOD POLST-CPM POLST ER 10-8 MG/5ML PO SUER: 5.0000 mL | Freq: Two times a day (BID) | ORAL | 0 refills | Status: DC | PRN

## 2020-11-20 MED ORDER — DOXYCYCLINE HYCLATE 100 MG PO TABS
100.0000 mg | ORAL_TABLET | Freq: Once | ORAL | Status: AC
  Administered 2020-11-20: 17:00:00 100 mg via ORAL
  Filled 2020-11-20: qty 1

## 2020-11-20 MED ORDER — DOXYCYCLINE HYCLATE 100 MG PO CAPS: 100.0000 mg | ORAL_CAPSULE | Freq: Two times a day (BID) | ORAL | 0 refills | Status: DC

## 2020-11-20 MED ORDER — GUAIFENESIN 200 MG PO TABS
200.0000 mg | ORAL_TABLET | Freq: Once | ORAL | Status: AC
  Administered 2020-11-20: 17:00:00 200 mg via ORAL
  Filled 2020-11-20: qty 1

## 2020-11-20 NOTE — ED Notes (Signed)
Patient discharged with instructions  Scripts sent to pharmacy  Ambulating to [facility]

## 2020-11-20 NOTE — ED Provider Notes (Signed)
[facility] EMERGENCY DEPARTMENT Provider Note   CSN: [id number] Arrival date & time: 11/20/20  1016     History No chief complaint on file.   Diana Mendez is a 59 y.o. female.  Patient presents to the emergency department for continued right-sided rib cage pain and cough.  Patient states that she has been having a cough for about 3 weeks.  She was seen at [facility] on 12/5 and was diagnosed with a likely virus.  She had lab work, chest x-ray, fluid Covid testing at that time which were reassuring.  Patient has significant right-sided and right anterior chest wall pain that is worse with palpation and with movement.  This also hurts more when she takes a deep breath.  Cough is productive.  No reported fevers at home.  She has been taking Tylenol and ibuprofen for the soreness.  No abdominal pain, vomiting, diarrhea.  Patient denies risk factors for pulmonary embolism including: unilateral leg swelling, history of DVT/PE/other blood clots, use of exogenous hormones, recent immobilizations, recent surgery, recent travel (>4hr segment), malignancy, hemoptysis.          Past Medical History:  Diagnosis Date  . Anxiety   . Asthma   . Depression   . Diabetes mellitus without complication (HCC)   . High cholesterol   . Hypertension     Patient Active Problem List   Diagnosis Date Noted  . Generalized anxiety disorder 10/18/2020  . Mild episode of recurrent major depressive disorder (HCC) 10/18/2020  . DM type 2 (diabetes mellitus, type 2) (HCC) 03/05/2020  . Acute diverticulitis 03/04/2020  . Diverticulitis 03/04/2020    Past Surgical History:  Procedure Laterality Date  . ABDOMINAL HYSTERECTOMY     partial  . BUNIONECTOMY    . cervical disectomy    . CHOLECYSTECTOMY    . TUBAL LIGATION       OB History   No obstetric history on file.     Family History  Problem Relation Age of Onset  . Hypertension Mother   . Diabetes Mother     Social  History   Tobacco Use  . Smoking status: Never Smoker  . Smokeless tobacco: Never Used  Vaping Use  . Vaping Use: Never used  Substance Use Topics  . Alcohol use: Never  . Drug use: Never    Home Medications Prior to Admission medications   Medication Sig Start Date End Date Taking? Authorizing Provider  albuterol (VENTOLIN HFA) 108 (90 Base) MCG/ACT inhaler Inhale 1 puff into the lungs in the morning and at bedtime.    [provider]  amLODipine (NORVASC) 5 MG tablet Take 1 tablet (5 mg total) by mouth daily. 07/23/18 11/12/20  Curatolo, Adam, DO  escitalopram (LEXAPRO) 10 MG tablet Take 1 tablet (10 mg total) by mouth daily. 10/18/20   Toy Cookey E, NP  fluticasone furoate-vilanterol (BREO ELLIPTA) 200-25 MCG/INH AEPB Inhale 1 puff into the lungs daily.    [provider]  metFORMIN (GLUCOPHAGE) 500 MG tablet Take 500 mg by mouth 2 (two) times daily with a meal.    [provider]  rosuvastatin (CRESTOR) 10 MG tablet Take 10 mg by mouth at bedtime.    [provider]  traZODone (DESYREL) 100 MG tablet Take 1 tablet (100 mg total) by mouth at bedtime. 10/18/20   Shanna Cisco, NP  valsartan-hydrochlorothiazide (DIOVAN-HCT) 320-25 MG tablet Take 1 tablet by mouth daily. 03/17/20 11/20/20  [provider]  Allergies    Flexeril [cyclobenzaprine] and Lisinopril  Review of Systems   Review of Systems  Constitutional: Negative for chills, fatigue and fever.  HENT: Negative for congestion, ear pain, rhinorrhea, sinus pressure and sore throat.   Eyes: Negative for redness.  Respiratory: Positive for cough. Negative for shortness of breath and wheezing.   Cardiovascular: Positive for chest pain.  Gastrointestinal: Negative for abdominal pain, diarrhea, nausea and vomiting.  Genitourinary: Negative for dysuria.  Musculoskeletal: Negative for myalgias and neck stiffness.  Skin: Negative for rash.  Neurological: Negative for  headaches.  Hematological: Negative for adenopathy.    Physical Exam Updated Vital Signs BP (!) 160/98 (BP Location: Right Arm)   Pulse 82   Temp 98.6 F (37 C) (Oral)   Resp 16   SpO2 94%   Physical Exam Vitals and nursing note reviewed.  Constitutional:      General: She is not in acute distress.    Appearance: She is well-developed.  HENT:     Head: Normocephalic and atraumatic.     Right Ear: External ear normal.     Left Ear: External ear normal.     Nose: Nose normal.  Eyes:     Conjunctiva/sclera: Conjunctivae normal.  Cardiovascular:     Rate and Rhythm: Normal rate and regular rhythm.     Heart sounds: No murmur heard.   Pulmonary:     Effort: No respiratory distress.     Breath sounds: No wheezing, rhonchi or rales.  Chest:    Abdominal:     Palpations: Abdomen is soft.     Tenderness: There is no abdominal tenderness. There is no guarding or rebound.  Musculoskeletal:     Cervical back: Normal range of motion and neck supple.     Right lower leg: No edema.     Left lower leg: No edema.  Skin:    General: Skin is warm and dry.     Findings: No rash.  Neurological:     General: No focal deficit present.     Mental Status: She is alert. Mental status is at baseline.     Motor: No weakness.  Psychiatric:        Mood and Affect: Mood normal.     ED Results / Procedures / Treatments   Labs (all labs ordered are listed, but only abnormal results are displayed) Labs Reviewed - No data to display  EKG None  Radiology DG Chest 2 View  Result Date: 11/20/2020 CLINICAL DATA:  Cough, right-sided chest pain. EXAM: CHEST - 2 VIEW COMPARISON:  November 12, 2020. FINDINGS: Stable cardiomediastinal silhouette. No pneumothorax or pleural effusion is noted. Both lungs are clear. The visualized skeletal structures are unremarkable. IMPRESSION: No active cardiopulmonary disease. Electronically Signed   By: Lupita Raider M.D.   On: 11/20/2020 16:15     Procedures Procedures (including critical care time)  Medications Ordered in ED Medications  guaiFENesin (ROBITUSSIN) 100 MG/5ML solution 100 mg (has no administration in time range)    ED Course  I have reviewed the triage vital signs and the nursing notes.  Pertinent labs & imaging results that were available during my care of the patient were reviewed by me and considered in my medical decision making (see chart for details).  Patient seen and examined. Reviewed previous visit and work-up.  Patient does not appear to be in distress.  She has readily reproducible right-sided chest wall pain.  Lungs are clear on exam.  Given report of  worsening cough that is productive, will recheck x-ray to evaluate for infiltrate.  She may need treatment for bronchitis if this is clear.  She be given cough medication and medication for home.  She is riding the bus home today.  Vital signs reviewed and are as follows: BP (!) 160/98 (BP Location: Right Arm)   Pulse 82   Temp 98.6 F (37 C) (Oral)   Resp 16   SpO2 94%   4:24 PM CXR reviewed and is negative. Suspect MSK pain vs pleurisy. Given duration of symptoms > 10 days and reported productive cough, will prescribe doxycycline x7 days.  Will be given cough medication for symptom control.  Otherwise patient instructed to use OTC meds.  Urged return with worsening shortness of breath, uncontrolled pain, lightheadedness or syncope, fever.  Patient counseled on use of narcotic cough medications. Counseled not to combine these medications with others containing tylenol. Urged not to drink alcohol, drive, or perform any other activities that requires focus while taking these medications. The patient verbalizes understanding and agrees with the plan.    MDM Rules/Calculators/A&P                           Final Clinical Impression(s) / ED Diagnoses Final diagnoses:  Cough  Chest wall pain    Rx / DC Orders ED Discharge Orders         Ordered     doxycycline (VIBRAMYCIN) 100 MG capsule  2 times daily        11/20/20 1621    chlorpheniramine-HYDROcodone (TUSSIONEX PENNKINETIC ER) 10-8 MG/5ML SUER  Every 12 hours PRN        11/20/20 1621           Renne Crigler, PA-C 11/20/20 1627    Sabas Sous, MD 11/20/20 [phone]

## 2020-11-20 NOTE — Discharge Instructions (Signed)
Please read and follow all provided instructions.  Your diagnoses today include:  1. Cough   2. Chest wall pain     Tests performed today include:  Chest x-ray - does not show any pneumonia  Vital signs. See below for your results today.   Medications prescribed:   Doxycycline - antibiotic  You have been prescribed an antibiotic medicine: take the entire course of medicine even if you are feeling better. Stopping early can cause the antibiotic not to work.   Tussinex - narcotic cough suppressant syrup  You have been prescribed narcotic cough suppressant such as Tussinex: DO NOT drive or perform any activities that require you to be awake and alert because this medicine can make you drowsy.   Take any prescribed medications only as directed.  Home care instructions:  Follow any educational materials contained in this packet.  Follow-up instructions: Please follow-up with your primary care provider in the next 3 days for further evaluation of your symptoms and a recheck if you are not feeling better.   Return instructions:   Please return to the Emergency Department if you experience worsening symptoms.  Please return with worsening wheezing, shortness of breath, or difficulty breathing.  Return with persistent fever above 101F.   Please return if you have any other emergent concerns.  Additional Information:  Your vital signs today were: BP (!) 160/98 (BP Location: Right Arm)   Pulse 82   Temp 98.6 F (37 C) (Oral)   Resp 16   SpO2 94%  If your blood pressure (BP) was elevated above 135/85 this visit, please have this repeated by your doctor within one month. --------------

## 2020-11-20 NOTE — ED Triage Notes (Signed)
Pt sts she had "a bug" a few weeks ago and has residual R sided rib cage pain with movement and coughing x 3 weeks.

## 2020-11-28 ENCOUNTER — Encounter (HOSPITAL_COMMUNITY): Payer: Self-pay | Admitting: Psychiatry

## 2020-11-28 ENCOUNTER — Ambulatory Visit (INDEPENDENT_AMBULATORY_CARE_PROVIDER_SITE_OTHER): Payer: Medicare (Managed Care) | Admitting: Psychiatry

## 2020-11-28 ENCOUNTER — Other Ambulatory Visit: Payer: Self-pay

## 2020-11-28 VITALS — BP 142/92 | HR 84 | Ht 68.0 in

## 2020-11-28 DIAGNOSIS — F330 Major depressive disorder, recurrent, mild: Secondary | ICD-10-CM | POA: Diagnosis not present

## 2020-11-28 DIAGNOSIS — F411 Generalized anxiety disorder: Secondary | ICD-10-CM

## 2020-11-28 DIAGNOSIS — F3181 Bipolar II disorder: Secondary | ICD-10-CM | POA: Diagnosis not present

## 2020-11-28 MED ORDER — ESCITALOPRAM OXALATE 10 MG PO TABS: 10.0000 mg | ORAL_TABLET | Freq: Every day | ORAL | 2 refills | Status: DC

## 2020-11-28 MED ORDER — TRAZODONE HCL 100 MG PO TABS: 100.0000 mg | ORAL_TABLET | Freq: Every day | ORAL | 2 refills | Status: DC

## 2020-11-28 MED ORDER — ARIPIPRAZOLE 5 MG PO TABS: 5.0000 mg | ORAL_TABLET | Freq: Every day | ORAL | 2 refills | Status: DC
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
## 2020-12-13 DIAGNOSIS — Z1329 Encounter for screening for other suspected endocrine disorder: Secondary | ICD-10-CM | POA: Diagnosis not present

## 2020-12-13 DIAGNOSIS — F3181 Bipolar II disorder: Secondary | ICD-10-CM | POA: Diagnosis not present

## 2020-12-13 DIAGNOSIS — E119 Type 2 diabetes mellitus without complications: Secondary | ICD-10-CM | POA: Diagnosis not present

## 2020-12-13 DIAGNOSIS — F411 Generalized anxiety disorder: Secondary | ICD-10-CM | POA: Diagnosis not present

## 2020-12-13 DIAGNOSIS — Z130 Encounter for screening for diseases of the blood and blood-forming organs and certain disorders involving the immune mechanism: Secondary | ICD-10-CM | POA: Diagnosis not present

## 2020-12-13 DIAGNOSIS — Z114 Encounter for screening for human immunodeficiency virus [HIV]: Secondary | ICD-10-CM | POA: Diagnosis not present

## 2020-12-13 DIAGNOSIS — J45909 Unspecified asthma, uncomplicated: Secondary | ICD-10-CM | POA: Diagnosis not present

## 2020-12-13 DIAGNOSIS — F330 Major depressive disorder, recurrent, mild: Secondary | ICD-10-CM | POA: Diagnosis not present

## 2020-12-13 DIAGNOSIS — Z0189 Encounter for other specified special examinations: Secondary | ICD-10-CM | POA: Diagnosis not present

## 2020-12-13 DIAGNOSIS — Z113 Encounter for screening for infections with a predominantly sexual mode of transmission: Secondary | ICD-10-CM | POA: Diagnosis not present

## 2020-12-13 DIAGNOSIS — E782 Mixed hyperlipidemia: Secondary | ICD-10-CM | POA: Diagnosis not present

## 2020-12-28 ENCOUNTER — Ambulatory Visit (INDEPENDENT_AMBULATORY_CARE_PROVIDER_SITE_OTHER): Payer: Medicare HMO

## 2021-01-08 DIAGNOSIS — J45909 Unspecified asthma, uncomplicated: Secondary | ICD-10-CM | POA: Diagnosis not present

## 2021-01-08 DIAGNOSIS — E782 Mixed hyperlipidemia: Secondary | ICD-10-CM | POA: Diagnosis not present

## 2021-01-08 DIAGNOSIS — F3181 Bipolar II disorder: Secondary | ICD-10-CM | POA: Diagnosis not present

## 2021-01-08 DIAGNOSIS — R350 Frequency of micturition: Secondary | ICD-10-CM | POA: Diagnosis not present

## 2021-01-08 DIAGNOSIS — Z0001 Encounter for general adult medical examination with abnormal findings: Secondary | ICD-10-CM | POA: Diagnosis not present

## 2021-01-08 DIAGNOSIS — N3941 Urge incontinence: Secondary | ICD-10-CM | POA: Diagnosis not present

## 2021-01-08 DIAGNOSIS — Z01419 Encounter for gynecological examination (general) (routine) without abnormal findings: Secondary | ICD-10-CM | POA: Diagnosis not present

## 2021-01-08 DIAGNOSIS — E119 Type 2 diabetes mellitus without complications: Secondary | ICD-10-CM | POA: Diagnosis not present

## 2021-01-08 DIAGNOSIS — I10 Essential (primary) hypertension: Secondary | ICD-10-CM | POA: Diagnosis not present

## 2021-01-29 ENCOUNTER — Telehealth (INDEPENDENT_AMBULATORY_CARE_PROVIDER_SITE_OTHER): Payer: Medicare HMO | Admitting: Psychiatry

## 2021-01-29 ENCOUNTER — Other Ambulatory Visit: Payer: Self-pay

## 2021-01-29 ENCOUNTER — Encounter (HOSPITAL_COMMUNITY): Payer: Self-pay | Admitting: Psychiatry

## 2021-01-29 DIAGNOSIS — F3181 Bipolar II disorder: Secondary | ICD-10-CM

## 2021-01-29 DIAGNOSIS — F411 Generalized anxiety disorder: Secondary | ICD-10-CM

## 2021-01-29 DIAGNOSIS — F330 Major depressive disorder, recurrent, mild: Secondary | ICD-10-CM

## 2021-01-29 MED ORDER — TRAZODONE HCL 100 MG PO TABS: 100.0000 mg | ORAL_TABLET | Freq: Every day | ORAL | 2 refills | Status: DC

## 2021-01-29 MED ORDER — ESCITALOPRAM OXALATE 10 MG PO TABS: 10.0000 mg | ORAL_TABLET | Freq: Every day | ORAL | 2 refills | Status: DC

## 2021-01-29 MED ORDER — ARIPIPRAZOLE 5 MG PO TABS: 5.0000 mg | ORAL_TABLET | Freq: Every day | ORAL | 2 refills | Status: DC
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
## 2021-01-30 ENCOUNTER — Other Ambulatory Visit (HOSPITAL_COMMUNITY): Payer: Self-pay | Admitting: Psychiatry

## 2021-01-30 DIAGNOSIS — F411 Generalized anxiety disorder: Secondary | ICD-10-CM

## 2021-03-12 DIAGNOSIS — H5213 Myopia, bilateral: Secondary | ICD-10-CM | POA: Diagnosis not present

## 2021-03-12 DIAGNOSIS — H52209 Unspecified astigmatism, unspecified eye: Secondary | ICD-10-CM | POA: Diagnosis not present

## 2021-03-12 DIAGNOSIS — H524 Presbyopia: Secondary | ICD-10-CM | POA: Diagnosis not present

## 2021-03-21 ENCOUNTER — Ambulatory Visit (HOSPITAL_COMMUNITY): Payer: Medicare HMO | Admitting: Licensed Clinical Social Worker

## 2021-03-26 ENCOUNTER — Other Ambulatory Visit: Payer: Self-pay

## 2021-03-26 ENCOUNTER — Ambulatory Visit (HOSPITAL_COMMUNITY): Payer: Medicaid Other | Admitting: Licensed Clinical Social Worker

## 2021-03-26 ENCOUNTER — Telehealth (HOSPITAL_COMMUNITY): Payer: Self-pay | Admitting: Licensed Clinical Social Worker

## 2021-03-26 NOTE — Telephone Encounter (Signed)
LCSW phoned pt per schedule at 9a. The phone range many times with no answer and the call never transitioned to a vmb so no message was able to be left.

## 2021-04-23 ENCOUNTER — Telehealth (HOSPITAL_COMMUNITY): Payer: Medicare HMO | Admitting: Psychiatry

## 2021-04-30 ENCOUNTER — Encounter (HOSPITAL_COMMUNITY): Payer: Medicare HMO | Admitting: Psychiatry

## 2021-05-09 ENCOUNTER — Other Ambulatory Visit: Payer: Self-pay

## 2021-05-09 ENCOUNTER — Encounter (HOSPITAL_COMMUNITY): Payer: Self-pay

## 2021-05-09 ENCOUNTER — Emergency Department (HOSPITAL_COMMUNITY)
Admission: EM | Admit: 2021-05-09 | Discharge: 2021-05-09 | Disposition: A | Discharge status: Home / Self Care | Payer: Medicare Other | Attending: Emergency Medicine | Admitting: Emergency Medicine

## 2021-05-09 DIAGNOSIS — M79652 Pain in left thigh: Secondary | ICD-10-CM | POA: Insufficient documentation

## 2021-05-09 DIAGNOSIS — Z5321 Procedure and treatment not carried out due to patient leaving prior to being seen by health care provider: Secondary | ICD-10-CM | POA: Insufficient documentation

## 2021-05-09 NOTE — ED Notes (Signed)
Pt eloped. Pt did not tell staff she was leaving.  

## 2021-05-09 NOTE — ED Triage Notes (Signed)
Pt c/o left thigh pain, has been an ongoing problem for 1 month. Pt states pain worsens with walking. Pt states she has been taking ibeprofen, tylenol, advil with no relief. Pt states she has varicose veins.

## 2021-05-21 ENCOUNTER — Ambulatory Visit (HOSPITAL_COMMUNITY): Payer: Medicare Other | Admitting: Licensed Clinical Social Worker

## 2021-05-21 ENCOUNTER — Other Ambulatory Visit: Payer: Self-pay

## 2021-05-21 DIAGNOSIS — F411 Generalized anxiety disorder: Secondary | ICD-10-CM

## 2021-05-21 DIAGNOSIS — F331 Major depressive disorder, recurrent, moderate: Secondary | ICD-10-CM
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
## 2021-06-04 ENCOUNTER — Other Ambulatory Visit: Payer: Self-pay

## 2021-06-04 ENCOUNTER — Ambulatory Visit (INDEPENDENT_AMBULATORY_CARE_PROVIDER_SITE_OTHER): Payer: Medicare Other | Admitting: Psychiatry

## 2021-06-04 ENCOUNTER — Encounter (HOSPITAL_COMMUNITY): Payer: Self-pay | Admitting: Psychiatry

## 2021-06-04 DIAGNOSIS — F411 Generalized anxiety disorder: Secondary | ICD-10-CM | POA: Diagnosis not present

## 2021-06-04 DIAGNOSIS — F3181 Bipolar II disorder: Secondary | ICD-10-CM

## 2021-06-04 DIAGNOSIS — F330 Major depressive disorder, recurrent, mild: Secondary | ICD-10-CM | POA: Diagnosis not present

## 2021-06-04 MED ORDER — HYDROXYZINE HCL 10 MG PO TABS: 10.0000 mg | ORAL_TABLET | Freq: Three times a day (TID) | ORAL | 2 refills | Status: DC | PRN

## 2021-06-04 MED ORDER — TRAZODONE HCL 100 MG PO TABS: 100.0000 mg | ORAL_TABLET | Freq: Every day | ORAL | 2 refills | Status: DC

## 2021-06-04 MED ORDER — ESCITALOPRAM OXALATE 10 MG PO TABS: 10.0000 mg | ORAL_TABLET | Freq: Every day | ORAL | 2 refills | Status: DC

## 2021-06-04 MED ORDER — ARIPIPRAZOLE 5 MG PO TABS: 5.0000 mg | ORAL_TABLET | Freq: Every day | ORAL | 2 refills | Status: DC
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
## 2021-06-06 ENCOUNTER — Telehealth (HOSPITAL_COMMUNITY): Payer: Self-pay | Admitting: Emergency Medicine

## 2021-06-06 NOTE — BH Assessment (Signed)
Care Management - Follow Up [facility] Discharges   Writer attempted to make contact with patient today and was unsuccessful.  Writer was able to leave a HIPPA compliant voice message and will await callback.   

## 2021-07-08 ENCOUNTER — Encounter (HOSPITAL_COMMUNITY): Payer: Self-pay | Admitting: Emergency Medicine

## 2021-07-08 ENCOUNTER — Other Ambulatory Visit: Payer: Self-pay

## 2021-07-08 ENCOUNTER — Emergency Department (HOSPITAL_COMMUNITY)
Admission: EM | Admit: 2021-07-08 | Discharge: 2021-07-08 | Disposition: A | Discharge status: Home / Self Care | Payer: Medicare Other | Attending: Emergency Medicine | Admitting: Emergency Medicine

## 2021-07-08 DIAGNOSIS — R079 Chest pain, unspecified: Secondary | ICD-10-CM | POA: Diagnosis present

## 2021-07-08 DIAGNOSIS — Z733 Stress, not elsewhere classified: Secondary | ICD-10-CM | POA: Insufficient documentation

## 2021-07-08 DIAGNOSIS — Z5321 Procedure and treatment not carried out due to patient leaving prior to being seen by health care provider: Secondary | ICD-10-CM | POA: Diagnosis not present

## 2021-07-08 LAB — BASIC METABOLIC PANEL
Anion gap: 7 (ref 5–15)
BUN: 14 mg/dL (ref 8–23)
CO2: 27 mmol/L (ref 22–32)
Calcium: 9.4 mg/dL (ref 8.9–10.3)
Chloride: 104 mmol/L (ref 98–111)
Creatinine, Ser: 0.75 mg/dL (ref 0.44–1.00)
GFR, Estimated: >60 mL/min (ref >60)
Glucose, Bld: 97 mg/dL (ref 70–99)
Potassium: 3.6 mmol/L (ref 3.5–5.1)
Sodium: 138 mmol/L (ref 135–145)

## 2021-07-08 LAB — CBC WITH DIFFERENTIAL/PLATELET
Abs Immature Granulocytes: 0.02 10*3/uL (ref 0.00–0.07)
Basophils Absolute: 0 10*3/uL (ref 0.0–0.1)
Basophils Relative: 1 %
Eosinophils Absolute: 0.1 10*3/uL (ref 0.0–0.5)
Eosinophils Relative: 2 %
HCT: 38 % (ref 36.0–46.0)
Hemoglobin: 12.2 g/dL (ref 12.0–15.0)
Immature Granulocytes: 0 %
Lymphocytes Relative: 34 %
Lymphs Abs: 1.9 10*3/uL (ref 0.7–4.0)
MCH: 27.6 pg (ref 26.0–34.0)
MCHC: 32.1 g/dL (ref 30.0–36.0)
MCV: 86 fL (ref 80.0–100.0)
Monocytes Absolute: 0.4 10*3/uL (ref 0.1–1.0)
Monocytes Relative: 7 %
Neutro Abs: 3.1 10*3/uL (ref 1.7–7.7)
Neutrophils Relative %: 56 %
Platelets: 283 10*3/uL (ref 150–400)
RBC: 4.42 MIL/uL (ref 3.87–5.11)
RDW: 13.8 % (ref 11.5–15.5)
WBC: 5.5 10*3/uL (ref 4.0–10.5)
nRBC: 0 % (ref 0.0–0.2)

## 2021-07-08 LAB — TROPONIN I (HIGH SENSITIVITY): Troponin I (High Sensitivity): 10 ng/L (ref <18)

## 2021-07-08 NOTE — ED Provider Notes (Signed)
Emergency Medicine Provider Triage Evaluation Note  Diana Mendez , a 60 y.o. female  was evaluated in triage.  Pt complains of CP. Began earlier today. Does not radiate. On EMS arrival patient was upset and hyperventilating. Has had similar episode previously. No LE edema, history PE, DVT. No cough  Review of Systems  Positive: cp Negative: Back pain, LE edema, nausea, diaphoresis  Physical Exam  There were no vitals taken for this visit. Gen:   Awake, no distress   Resp:  Normal effort  MSK:   Moves extremities without difficulty  Other:    Medical Decision Making  Medically screening exam initiated at 11:52 AM.  Appropriate orders placed.  Diana Mendez was informed that the remainder of the evaluation will be completed by another provider, this initial triage assessment does not replace that evaluation, and the importance of remaining in the ED until their evaluation is complete.  cp   Linwood Dibbles, PA-C 07/08/21 1153    Terald Sleeper, MD 07/09/21 [phone]
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
## 2021-07-08 NOTE — ED Notes (Signed)
Pt questioning wait for treatment room.  Explained wait to pt and she states she is leaving.  Encouraged her to stay and she request for IV to be removed.  IV removed by Darrel, EMT.  Catheter intact.

## 2021-07-11 ENCOUNTER — Ambulatory Visit: Payer: Medicare Other | Admitting: Podiatry

## 2021-07-13 ENCOUNTER — Ambulatory Visit: Payer: Medicare Other | Admitting: Podiatry

## 2021-07-23 ENCOUNTER — Ambulatory Visit (HOSPITAL_COMMUNITY): Payer: Medicare Other | Admitting: Licensed Clinical Social Worker

## 2021-08-15 ENCOUNTER — Ambulatory Visit (HOSPITAL_COMMUNITY): Payer: Self-pay | Admitting: Licensed Clinical Social Worker

## 2021-09-04 ENCOUNTER — Other Ambulatory Visit (HOSPITAL_COMMUNITY): Payer: Self-pay | Admitting: Psychiatry

## 2021-09-04 DIAGNOSIS — F3181 Bipolar II disorder: Secondary | ICD-10-CM

## 2021-09-05 ENCOUNTER — Encounter (HOSPITAL_COMMUNITY): Payer: Medicare Other | Admitting: Psychiatry

## 2021-10-02 ENCOUNTER — Other Ambulatory Visit: Payer: Self-pay

## 2021-10-02 ENCOUNTER — Ambulatory Visit (INDEPENDENT_AMBULATORY_CARE_PROVIDER_SITE_OTHER): Payer: Medicare Other | Admitting: Psychiatry

## 2021-10-02 ENCOUNTER — Ambulatory Visit: Payer: Self-pay

## 2021-10-02 ENCOUNTER — Encounter (HOSPITAL_COMMUNITY): Payer: Self-pay | Admitting: Psychiatry

## 2021-10-02 DIAGNOSIS — F330 Major depressive disorder, recurrent, mild: Secondary | ICD-10-CM | POA: Diagnosis not present

## 2021-10-02 DIAGNOSIS — F411 Generalized anxiety disorder: Secondary | ICD-10-CM | POA: Diagnosis not present

## 2021-10-02 DIAGNOSIS — F3181 Bipolar II disorder: Secondary | ICD-10-CM

## 2021-10-02 MED ORDER — TRAZODONE HCL 150 MG PO TABS: 150.0000 mg | ORAL_TABLET | Freq: Every day | ORAL | 3 refills | Status: DC

## 2021-10-02 MED ORDER — HYDROXYZINE HCL 10 MG PO TABS: 10.0000 mg | ORAL_TABLET | Freq: Three times a day (TID) | ORAL | 3 refills | Status: DC | PRN

## 2021-10-02 MED ORDER — ESCITALOPRAM OXALATE 10 MG PO TABS: 10.0000 mg | ORAL_TABLET | Freq: Every day | ORAL | 3 refills | Status: DC

## 2021-10-02 MED ORDER — ARIPIPRAZOLE 5 MG PO TABS: ORAL_TABLET | ORAL | 3 refills | Status: DC

## 2021-10-02 NOTE — Progress Notes (Signed)
BH MD/PA/NP OP Progress Note   10/02/2021 3:24 PM Diana Mendez  MRN:  [id number]  Chief Complaint: "I am tired of working"   HPI: 59 year old female seen today for follow up psychiatric evaluation.  She has a psychiatric history of anxiety, depression, cocaine use (in remission for 26 years) and bipolar disorder.  She is currently  prescribed Lexapro 10 mg, hydroxyzine 10 mg 3 times daily, Abilify 5 mg daily, and trazodone 100 mg.  She notes that her medications are effective for managing her psychiatric conditions.    Today she was groomed, pleasant, cooperative and engaged in conversation.  She informed Clinical research associate that she is tired of working.  She notes that she quit her job at [employer] and is now working at [employer]. She reports that she is in pain most days and informed writer that recently she was informed that if she retires her disability will be reduced.  She notes that she feels that she will have to work for the rest of her life to maintain her bills.  She informed Clinical research associate that her disability covers her rent which is a little over 800$ but she has to work to pay her electricity and her water pill.  She informed Clinical research associate that her 8 children are not able to help her financially at this time.  Patient notes that most days her pain level is at a 10 out of 10.  She informed Clinical research associate that she has not been able to see her PCP due to finances and lack of insurance.  Patient given resources to [facility].    Patient notes that the above exacerbates her anxiety and depression.  Provider conducted a GAD-7 and patient scored a 16, at her last visit she scored a 20.  Provider also conducted PHQ-9 and patient scored a 14, at her last visit she scored a 20.  She notes that her sleep and appetite fluctuates.  Today she denies SI/HI/VAH.  She also endorse paranoia.  Patient notes that at night she places sheets over her windows because she thinks that someone will be looking in on her.   She notes that she has never lived without her children or partner and reports that now that she is she is having problems adjusting.    Today she is agreeable to increasing trazodone 100 mg to 150 mg to help manage sleep, anxiety, and depression.  Patient agreeable to continue all other medication as prescribed.  An appointment for therapy was scheduled for tomorrow to help patient manage current life stressors.  No other concerns noted at this time.       Visit Diagnosis:    ICD-10-CM   1. Bipolar 2 disorder, major depressive episode (HCC)  F31.81 ARIPiprazole (ABILIFY) 5 MG tablet    2. Generalized anxiety disorder  F41.1 escitalopram (LEXAPRO) 10 MG tablet    hydrOXYzine (ATARAX/VISTARIL) 10 MG tablet    traZODone (DESYREL) 150 MG tablet    3. Mild episode of recurrent major depressive disorder (HCC)  F33.0 escitalopram (LEXAPRO) 10 MG tablet    traZODone (DESYREL) 150 MG tablet       Past Psychiatric History: anxiety, depression, cocaine use (in remission for 26 years) and bipolar disorder.  Past Medical History:  Past Medical History:  Diagnosis Date   Anxiety    Asthma    Depression    Diabetes mellitus without complication (HCC)    High cholesterol    Hypertension     Past Surgical  History:  Procedure Laterality Date   ABDOMINAL HYSTERECTOMY     partial   BUNIONECTOMY     cervical disectomy     CHOLECYSTECTOMY     TUBAL LIGATION      Family Psychiatric History: Denies  Family History:  Family History  Problem Relation Age of Onset   Hypertension Mother    Diabetes Mother     Social History:  Social History   Socioeconomic History   Marital status: Single    Spouse name: Not on file   Number of children: Not on file   Years of education: Not on file   Highest education level: Not on file  Occupational History   Not on file  Tobacco Use   Smoking status: Never   Smokeless tobacco: Never  Vaping Use   Vaping Use: Never used  Substance and  Sexual Activity   Alcohol use: Never   Drug use: Never   Sexual activity: Yes  Other Topics Concern   Not on file  Social History Narrative   Not on file   Social Determinants of Health   Financial Resource Strain: Not on file  Food Insecurity: Not on file  Transportation Needs: Not on file  Physical Activity: Not on file  Stress: Not on file  Social Connections: Not on file    Allergies:  Allergies  Allergen Reactions   Flexeril [Cyclobenzaprine] Anaphylaxis   Lisinopril Diarrhea    Metabolic Disorder Labs: Lab Results  Component Value Date   HGBA1C 6.8 (H) 03/05/2020   MPG 148.46 03/05/2020   No results found for: PROLACTIN Lab Results  Component Value Date   CHOL 193 03/05/2020   TRIG 118 03/05/2020   HDL 48 03/05/2020   CHOLHDL 4.0 03/05/2020   VLDL 24 03/05/2020   LDLCALC 121 (H) 03/05/2020   No results found for: TSH  Therapeutic Level Labs: No results found for: LITHIUM No results found for: VALPROATE No components found for:  CBMZ  Current Medications: Current Outpatient Medications  Medication Sig Dispense Refill   albuterol (VENTOLIN HFA) 108 (90 Base) MCG/ACT inhaler Inhale 1 puff into the lungs in the morning and at bedtime.     chlorpheniramine-HYDROcodone (TUSSIONEX PENNKINETIC ER) 10-8 MG/5ML SUER Take 5 mLs by mouth every 12 (twelve) hours as needed for cough. 100 mL 0   doxycycline (VIBRAMYCIN) 100 MG capsule Take 1 capsule (100 mg total) by mouth 2 (two) times daily. 14 capsule 0   fluticasone furoate-vilanterol (BREO ELLIPTA) 200-25 MCG/INH AEPB Inhale 1 puff into the lungs daily.     metFORMIN (GLUCOPHAGE) 500 MG tablet Take 500 mg by mouth 2 (two) times daily with a meal.     rosuvastatin (CRESTOR) 10 MG tablet Take 10 mg by mouth at bedtime.     amLODipine (NORVASC) 5 MG tablet Take 1 tablet (5 mg total) by mouth daily. 30 tablet 0   ARIPiprazole (ABILIFY) 5 MG tablet TAKE 1 TABLET(5 MG) BY MOUTH DAILY 30 tablet 3   escitalopram  (LEXAPRO) 10 MG tablet Take 1 tablet (10 mg total) by mouth daily. 30 tablet 3   hydrOXYzine (ATARAX/VISTARIL) 10 MG tablet Take 1 tablet (10 mg total) by mouth 3 (three) times daily as needed. 90 tablet 3   traZODone (DESYREL) 150 MG tablet Take 1 tablet (150 mg total) by mouth at bedtime. 30 tablet 3   No current facility-administered medications for this visit.     Musculoskeletal: Strength & Muscle Tone: within normal limits Gait & Station: normal  Patient leans: N/A  Psychiatric Specialty Exam: Review of Systems  Blood pressure (!) 196/109, pulse 70, height 5\' 8"  (1.727 m), weight 155 lb (70.3 kg), SpO2 100 %.Body mass index is 23.57 kg/m.  General Appearance: Well Groomed  Eye Contact:  Good  Speech:  Clear and Coherent and Normal Rate  Volume:  Normal  Mood:  Anxious and Depressed  Affect:  Appropriate and Congruent  Thought Process:  Coherent, Goal Directed and Linear  Orientation:  Full (Time, Place, and Person)  Thought Content: WDL, Logical, and notes she becomes paranoid at night due to past trauma    Suicidal Thoughts:  No  Homicidal Thoughts:  No  Memory:  Immediate;   Good Recent;   Good Remote;   Good  Judgement:  Good  Insight:  Good  Psychomotor Activity:  Normal  Concentration:  Concentration: Good and Attention Span: Good  Recall:  Good  Fund of Knowledge: Good  Language: Good  Akathisia:  No  Handed:  Right  AIMS (if indicated): Not done  Assets:  Communication Skills Desire for Improvement Financial Resources/Insurance Housing Leisure Time  ADL's:  Intact  Cognition: WNL  Sleep:  Fair   Screenings: GAD-7    Flowsheet Row Clinical Support from 10/02/2021 in [facility] Clinical Support from 06/04/2021 in [facility] Video Visit from 01/29/2021 in [facility] Clinical Support from 11/28/2020 in [facility] Clinical Support from  10/18/2020 in [facility]  Total GAD-7 Score 16 20 17 15 17       PHQ2-9    Flowsheet Row Clinical Support from 10/02/2021 in [facility] Clinical Support from 06/04/2021 in [facility] Counselor from 05/21/2021 in [facility] Video Visit from 01/29/2021 in [facility] Clinical Support from 11/28/2020 in [facility]  PHQ-2 Total Score 4 6 4 5 2   PHQ-9 Total Score 14 20 14 17 11       Flowsheet Row Clinical Support from 10/02/2021 in [facility] ED from 07/08/2021 in [facility] EMERGENCY DEPARTMENT Clinical Support from 06/04/2021 in [facility]  C-SSRS RISK CATEGORY Low Risk No Risk No Risk        Assessment and Plan: Patient endorses insomnia, anxiety and depression. Today she is agreeable to increasing trazodone 100 mg to 150 mg to help manage sleep, anxiety, and depression.  Patient agreeable to continue all other medication as prescribed. 1. Generalized anxiety disorder  Continue- traZODone (DESYREL) 150 MG tablet; Take 1 tablet (150 mg total) by mouth at bedtime.  Dispense: 30 tablet; Refill: 3  2. Mild episode of recurrent major depressive disorder (HCC)  Continue- traZODone (DESYREL) 150 MG tablet; Take 1 tablet (100 mg total) by mouth at bedtime.  Dispense: 30 tablet; Refill: 3 Continue- escitalopram (LEXAPRO) 10 MG tablet; Take 1 tablet (10 mg total) by mouth daily.  Dispense: 30 tablet; Refill: 3 3. Bipolar 2 disorder, major depressive episode (HCC)  Continue- ARIPiprazole (ABILIFY) 5 MG tablet; Take 1 tablet (5 mg total) by mouth daily.  Dispense: 30 tablet; Refill: 3    Follow-up in 3 months Follow-up with therapy  07/10/2021, NP 10/02/2021, 3:24 PM

## 2021-10-02 NOTE — Telephone Encounter (Signed)
Pt. Report she was at behavorial health for appointment. BP there was 191/100, 191/96. Takes BP medication. Has not missed any doses. States she is having chest pain. Instructed to go to ED. Refuses. "I can't go over there and sit 12 hours. I'm going to call my doctor's office again." Instructed to go to ED.    Reason for Disposition  [1] Systolic BP  >= 160 OR Diastolic >= 100 AND [2] cardiac or neurologic symptoms (e.g., chest pain, difficulty breathing, unsteady gait, blurred vision)  Answer Assessment - Initial Assessment Questions 1. BLOOD PRESSURE: "What is the blood pressure?" "Did you take at least two measurements 5 minutes apart?"     191/100   191/96 2. ONSET: "When did you take your blood pressure?"     Today 3. HOW: "How did you obtain the blood pressure?" (e.g., visiting nurse, automatic home BP monitor)     Nurse checked it 4. HISTORY: "Do you have a history of high blood pressure?"     Yes 5. MEDICATIONS: "Are you taking any medications for blood pressure?" "Have you missed any doses recently?"     No missed doses 6. OTHER SYMPTOMS: "Do you have any symptoms?" (e.g., headache, chest pain, blurred vision, difficulty breathing, weakness)     Chest pain,headache 7. PREGNANCY: "Is there any chance you are pregnant?" "When was your last menstrual period?"     No  Protocols used: Blood Pressure - High-A-AH

## 2021-10-03 ENCOUNTER — Ambulatory Visit (HOSPITAL_COMMUNITY): Payer: Medicare Other | Admitting: Licensed Clinical Social Worker

## 2021-12-26 ENCOUNTER — Telehealth (HOSPITAL_COMMUNITY): Payer: 59 | Admitting: Psychiatry

## 2021-12-26 ENCOUNTER — Encounter (HOSPITAL_COMMUNITY): Payer: Self-pay

## 2021-12-28 ENCOUNTER — Ambulatory Visit (INDEPENDENT_AMBULATORY_CARE_PROVIDER_SITE_OTHER): Payer: 59 | Admitting: Diagnostic Neuroimaging

## 2021-12-28 ENCOUNTER — Encounter: Payer: Self-pay | Admitting: Diagnostic Neuroimaging

## 2021-12-28 VITALS — BP 138/91 | HR 87 | Ht 68.0 in | Wt 156.0 lb

## 2021-12-28 DIAGNOSIS — M79672 Pain in left foot: Secondary | ICD-10-CM | POA: Diagnosis not present

## 2021-12-28 DIAGNOSIS — M79671 Pain in right foot: Secondary | ICD-10-CM | POA: Diagnosis not present

## 2021-12-28 NOTE — Patient Instructions (Signed)
NEUROPATHY (likely due to diabetes; will check labs to rule out other causes) - increase gabapentin up to 300mg  2 or 3 times a day  - consider other painful neuropathy treatment options: - duloxetine 30-60mg  daily, amitriptyline 25-50mg  at bedtime, pregabalin 75-150mg  twice a day - capsaicin cream, lidocaine patch / cream, alpha-lipoic acid 600mg  daily

## 2021-12-28 NOTE — Progress Notes (Signed)
[facility]  PATIENT: Diana Mendez DOB: 1961/11/25  REFERRING CLINICIAN: Bonnetta Barry, PA-C HISTORY FROM: patient  REASON FOR VISIT: new consult     HISTORICAL  CHIEF COMPLAINT:  Chief Complaint  Patient presents with   RM 7    New pt appt for difficulty standing and walking, neuropathy worsening. Has been going on about 3 years but for the past 7 months it has gotten worse. She has been writing it down.     HISTORY OF PRESENT ILLNESS:   61 year old female here for evaluation of diabetic neuropathy.  Patient diagnosed with diabetes around 2009.  Since 2018 having significant numbness, pain, shooting sensations in the toes, feet and calves.  She has recently tried on gabapentin 3 mg at bedtime without relief.  She takes this medication sporadically and as needed.  She is concerned about taking higher dose of medication due to side effects of sedation.  A1c's have improved over time and recent level was 6.3 in July 2022.    REVIEW OF SYSTEMS: Full 14 system review of systems performed and negative with exception of: as per HPI.  ALLERGIES: Allergies  Allergen Reactions   Flexeril [Cyclobenzaprine] Anaphylaxis   Lisinopril Diarrhea    HOME MEDICATIONS: Outpatient Medications Prior to Visit  Medication Sig Dispense Refill   albuterol (VENTOLIN HFA) 108 (90 Base) MCG/ACT inhaler Inhale 1 puff into the lungs in the morning and at bedtime.     amLODipine (NORVASC) 5 MG tablet Take 1 tablet (5 mg total) by mouth daily. 30 tablet 0   ARIPiprazole (ABILIFY) 5 MG tablet TAKE 1 TABLET(5 MG) BY MOUTH DAILY 30 tablet 3   escitalopram (LEXAPRO) 10 MG tablet Take 1 tablet (10 mg total) by mouth daily. 30 tablet 3   fluticasone furoate-vilanterol (BREO ELLIPTA) 200-25 MCG/INH AEPB Inhale 1 puff into the lungs daily.     metFORMIN (GLUCOPHAGE) 500 MG tablet Take 500 mg by mouth 2 (two) times daily with a meal.     rosuvastatin (CRESTOR) 10 MG tablet Take 10  mg by mouth at bedtime.     traZODone (DESYREL) 150 MG tablet Take 1 tablet (150 mg total) by mouth at bedtime. 30 tablet 3   chlorpheniramine-HYDROcodone (TUSSIONEX PENNKINETIC ER) 10-8 MG/5ML SUER Take 5 mLs by mouth every 12 (twelve) hours as needed for cough. (Patient not taking: Reported on 12/28/2021) 100 mL 0   doxycycline (VIBRAMYCIN) 100 MG capsule Take 1 capsule (100 mg total) by mouth 2 (two) times daily. (Patient not taking: Reported on 12/28/2021) 14 capsule 0   hydrOXYzine (ATARAX/VISTARIL) 10 MG tablet Take 1 tablet (10 mg total) by mouth 3 (three) times daily as needed. (Patient not taking: Reported on 12/28/2021) 90 tablet 3   No facility-administered medications prior to visit.    PAST MEDICAL HISTORY: Past Medical History:  Diagnosis Date   Anxiety    Asthma    Depression    Diabetes mellitus without complication (HCC)    High cholesterol    Hypertension     PAST SURGICAL HISTORY: Past Surgical History:  Procedure Laterality Date   ABDOMINAL HYSTERECTOMY     partial   BUNIONECTOMY     cervical disectomy     CHOLECYSTECTOMY     TUBAL LIGATION      FAMILY HISTORY: Family History  Problem Relation Age of Onset   Hypertension Mother    Diabetes Mother    Leukemia Daughter    Lymphoma Son    Leukemia Granddaughter  Neuropathy Neg Hx     SOCIAL HISTORY: Social History   Socioeconomic History   Marital status: Single    Spouse name: Not on file   Number of children: Not on file   Years of education: Not on file   Highest education level: Not on file  Occupational History   Not on file  Tobacco Use   Smoking status: Former    Packs/day: 0.10    Types: Cigarettes    Quit date: 36    Years since quitting: 38.0   Smokeless tobacco: Never   Tobacco comments:    Smoked while drinking   Vaping Use   Vaping Use: Never used  Substance and Sexual Activity   Alcohol use: Never   Drug use: Never   Sexual activity: Yes  Other Topics Concern   Not  on file  Social History Narrative   Lives alone    Right handed   Caffeine: 1 cup/day   Social Determinants of Health   Financial Resource Strain: Not on file  Food Insecurity: Not on file  Transportation Needs: Not on file  Physical Activity: Not on file  Stress: Not on file  Social Connections: Not on file  Intimate Partner Violence: Not on file     PHYSICAL EXAM  GENERAL EXAM/CONSTITUTIONAL: Vitals:  Vitals:   12/28/21 0939  BP: (!) 138/91  Pulse: 87  Weight: 156 lb (70.8 kg)  Height: 5\' 8"  (1.727 m)   Body mass index is 23.72 kg/m. Wt Readings from Last 3 Encounters:  12/28/21 156 lb (70.8 kg)  11/04/20 153 lb (69.4 kg)  09/28/20 152 lb (68.9 kg)   Patient is RESTLESS; well developed, nourished and groomed; neck is supple  CARDIOVASCULAR: Examination of carotid arteries is normal; no carotid bruits Regular rate and rhythm, no murmurs Examination of peripheral vascular system by observation and palpation is normal  EYES: Ophthalmoscopic exam of optic discs and posterior segments is normal; no papilledema or hemorrhages No results found.  MUSCULOSKELETAL: Gait, strength, tone, movements noted in Neurologic exam below  NEUROLOGIC: MENTAL STATUS:  No flowsheet data found. awake, alert, oriented to person, place and time recent and remote memory intact normal attention and concentration language fluent, comprehension intact, naming intact fund of knowledge appropriate  CRANIAL NERVE:  2nd - no papilledema on fundoscopic exam 2nd, 3rd, 4th, 6th - pupils equal and reactive to light, visual fields full to confrontation, extraocular muscles intact, no nystagmus 5th - facial sensation symmetric 7th - facial strength symmetric 8th - hearing intact 9th - palate elevates symmetrically, uvula midline 11th - shoulder shrug symmetric 12th - tongue protrusion midline  MOTOR:  normal bulk and tone, full strength in the BUE, BLE; LIMITED BY PAIN IN  LEGS  SENSORY:  normal and symmetric to light touch, pinprick, temperature, vibration; except decr in feet  COORDINATION:  finger-nose-finger, fine finger movements normal  REFLEXES:  deep tendon reflexes TRACE and symmetric  GAIT/STATION:  narrow based gait; ANTALGIC GAIT     DIAGNOSTIC DATA (LABS, IMAGING, TESTING) - I reviewed patient records, labs, notes, testing and imaging myself where available.  Lab Results  Component Value Date   WBC 5.5 07/08/2021   HGB 12.2 07/08/2021   HCT 38.0 07/08/2021   MCV 86.0 07/08/2021   PLT 283 07/08/2021      Component Value Date/Time   NA 138 07/08/2021 1152   K 3.6 07/08/2021 1152   CL 104 07/08/2021 1152   CO2 27 07/08/2021 1152   GLUCOSE  97 07/08/2021 1152   BUN 14 07/08/2021 1152   CREATININE 0.75 07/08/2021 1152   CALCIUM 9.4 07/08/2021 1152   PROT 7.0 03/08/2020 0146   ALBUMIN 3.3 (L) 03/08/2020 0146   AST 18 03/08/2020 0146   ALT 15 03/08/2020 0146   ALKPHOS 74 03/08/2020 0146   BILITOT 0.3 03/08/2020 0146   GFRNONAA >60 07/08/2021 1152   GFRAA >60 07/21/2020 1330   Lab Results  Component Value Date   CHOL 193 03/05/2020   HDL 48 03/05/2020   LDLCALC 121 (H) 03/05/2020   TRIG 118 03/05/2020   CHOLHDL 4.0 03/05/2020   Lab Results  Component Value Date   HGBA1C 6.8 (H) 03/05/2020   No results found for: VITAMINB12 No results found for: TSH     ASSESSMENT AND PLAN  61 y.o. year old female here with lower extremity numbness, pain and tingling since 2018, likely due to diabetic neuropathy.  Will check labs to rule out other secondary causes.  Advised to gradually increase gabapentin to help with neuropathic pain control.  Other treatment options were reviewed with patient would like to hold off for now.  Dx:  1. Pain in both feet     PLAN:  NEUROPATHY (likely due to diabetes; will check labs to rule out other causes) - increase gabapentin up to 300mg  2 or 3 times a day - consider other painful  neuropathy treatment options: - duloxetine 30-60mg  daily, amitriptyline 25-50mg  at bedtime, pregabalin 75-150mg  twice a day - capsaicin cream, lidocaine patch / cream, alpha-lipoic acid 600mg  daily  Orders Placed This Encounter  Procedures   CBC with diff   CMP   Vitamin B12   A1c   TSH   SPEP with IFE   ANA w/Reflex   SSA, SSB   HIV   RPR   Vitamin B1   Vitamin B6   ANCA Profile   Copper   Hepatitis C antibody   Hepatitis B core antibody, total   Hepatitis B surface antigen   Hepatitis B surface antibody, qualitative   Heavy metals   Return for pending if symptoms worsen or fail to improve, pending test results, return to PCP.    Suanne MarkerVIKRAM R. Edana Aguado, MD 12/28/2021, 9:57 AM Certified in Neurology, Neurophysiology and Neuroimaging  [facility] [address], [address] [place], [place] [address] [phone]

## 2022-01-02 ENCOUNTER — Other Ambulatory Visit (HOSPITAL_COMMUNITY): Payer: Self-pay | Admitting: Psychiatry

## 2022-01-02 DIAGNOSIS — F330 Major depressive disorder, recurrent, mild: Secondary | ICD-10-CM

## 2022-01-02 DIAGNOSIS — F411 Generalized anxiety disorder: Secondary | ICD-10-CM

## 2022-01-03 ENCOUNTER — Telehealth: Payer: Self-pay | Admitting: Diagnostic Neuroimaging

## 2022-01-03 LAB — COMPREHENSIVE METABOLIC PANEL
ALT: 18 IU/L (ref 0–32)
AST: 20 IU/L (ref 0–40)
Albumin/Globulin Ratio: 1.5 (ref 1.2–2.2)
Albumin: 4.7 g/dL (ref 3.8–4.8)
Alkaline Phosphatase: 95 IU/L (ref 44–121)
BUN/Creatinine Ratio: 20 (ref 12–28)
BUN: 20 mg/dL (ref 8–27)
Bilirubin Total: 0.3 mg/dL (ref 0.0–1.2)
CO2: 23 mmol/L (ref 20–29)
Calcium: 10.2 mg/dL (ref 8.7–10.3)
Chloride: 101 mmol/L (ref 96–106)
Creatinine, Ser: 1.02 mg/dL — ABNORMAL HIGH (ref 0.57–1.00)
Globulin, Total: 3.1 g/dL (ref 1.5–4.5)
Glucose: 145 mg/dL — ABNORMAL HIGH (ref 70–99)
Potassium: 3.9 mmol/L (ref 3.5–5.2)
Sodium: 141 mmol/L (ref 134–144)
Total Protein: 7.8 g/dL (ref 6.0–8.5)
eGFR: 62 mL/min/{1.73_m2} (ref >59)

## 2022-01-03 LAB — MULTIPLE MYELOMA PANEL, SERUM
Albumin SerPl Elph-Mcnc: 3.9 g/dL (ref 2.9–4.4)
Albumin/Glob SerPl: 1.1 (ref 0.7–1.7)
Alpha 1: 0.3 g/dL (ref 0.0–0.4)
Alpha2 Glob SerPl Elph-Mcnc: 0.8 g/dL (ref 0.4–1.0)
B-Globulin SerPl Elph-Mcnc: 1.2 g/dL (ref 0.7–1.3)
Gamma Glob SerPl Elph-Mcnc: 1.6 g/dL (ref 0.4–1.8)
Globulin, Total: 3.9 g/dL (ref 2.2–3.9)
IgA/Immunoglobulin A, Serum: 168 mg/dL (ref 87–352)
IgG (Immunoglobin G), Serum: 1537 mg/dL (ref 586–1602)
IgM (Immunoglobulin M), Srm: 45 mg/dL (ref 26–217)

## 2022-01-03 LAB — CBC WITH DIFFERENTIAL/PLATELET
Basophils Absolute: 0 10*3/uL (ref 0.0–0.2)
Basos: 1 %
EOS (ABSOLUTE): 0.1 10*3/uL (ref 0.0–0.4)
Eos: 2 %
Hematocrit: 38.2 % (ref 34.0–46.6)
Hemoglobin: 13.1 g/dL (ref 11.1–15.9)
Immature Grans (Abs): 0 10*3/uL (ref 0.0–0.1)
Immature Granulocytes: 0 %
Lymphocytes Absolute: 1.7 10*3/uL (ref 0.7–3.1)
Lymphs: 32 %
MCH: 28.4 pg (ref 26.6–33.0)
MCHC: 34.3 g/dL (ref 31.5–35.7)
MCV: 83 fL (ref 79–97)
Monocytes Absolute: 0.4 10*3/uL (ref 0.1–0.9)
Monocytes: 7 %
Neutrophils Absolute: 3 10*3/uL (ref 1.4–7.0)
Neutrophils: 58 %
Platelets: 310 10*3/uL (ref 150–450)
RBC: 4.61 x10E6/uL (ref 3.77–5.28)
RDW: 13.6 % (ref 11.7–15.4)
WBC: 5.2 10*3/uL (ref 3.4–10.8)

## 2022-01-03 LAB — COPPER, SERUM: Copper: 112 ug/dL (ref 80–158)

## 2022-01-03 LAB — ANCA PROFILE
Anti-MPO Antibodies: <0.2 units (ref 0.0–0.9)
Anti-PR3 Antibodies: <0.2 units (ref 0.0–0.9)
Atypical pANCA: <1:20 {titer}
C-ANCA: <1:20 {titer}
P-ANCA: <1:20 {titer}

## 2022-01-03 LAB — HEMOGLOBIN A1C
Est. average glucose Bld gHb Est-mCnc: 143 mg/dL
Hgb A1c MFr Bld: 6.6 % — ABNORMAL HIGH (ref 4.8–5.6)

## 2022-01-03 LAB — TSH: TSH: 2.74 u[IU]/mL (ref 0.450–4.500)

## 2022-01-03 LAB — HEPATITIS B SURFACE ANTIBODY,QUALITATIVE: Hep B Surface Ab, Qual: NONREACTIVE

## 2022-01-03 LAB — ANA W/REFLEX: ANA Titer 1: NEGATIVE

## 2022-01-03 LAB — HEAVY METALS, BLOOD
Arsenic: 3 ug/L (ref 0–9)
Lead, Blood: 1.6 ug/dL (ref 0.0–3.4)
Mercury: <1 ug/L (ref 0.0–14.9)

## 2022-01-03 LAB — HEPATITIS C ANTIBODY: Hep C Virus Ab: <0.1 s/co ratio (ref 0.0–0.9)

## 2022-01-03 LAB — SJOGREN'S SYNDROME ANTIBODS(SSA + SSB)
ENA SSA (RO) Ab: <0.2 AI (ref 0.0–0.9)
ENA SSB (LA) Ab: <0.2 AI (ref 0.0–0.9)

## 2022-01-03 LAB — VITAMIN B1: Thiamine: 85.7 nmol/L (ref 66.5–200.0)

## 2022-01-03 LAB — RPR: RPR Ser Ql: NONREACTIVE

## 2022-01-03 LAB — HEPATITIS B CORE ANTIBODY, TOTAL: Hep B Core Total Ab: NEGATIVE

## 2022-01-03 LAB — VITAMIN B6: Vitamin B6: 39.4 ug/L (ref 3.4–65.2)

## 2022-01-03 LAB — HIV ANTIBODY (ROUTINE TESTING W REFLEX): HIV Screen 4th Generation wRfx: NONREACTIVE

## 2022-01-03 LAB — HEPATITIS B SURFACE ANTIGEN: Hepatitis B Surface Ag: NEGATIVE

## 2022-01-03 LAB — VITAMIN B12: Vitamin B-12: 772 pg/mL (ref 232–1245)

## 2022-01-03 NOTE — Telephone Encounter (Signed)
Pt called and LVM wanting to know when she will be called with her blood work results. Please advise.

## 2022-01-08 ENCOUNTER — Telehealth: Payer: Self-pay

## 2022-01-08 NOTE — Telephone Encounter (Signed)
Contacted pt, LVM per DPR, informing her labs were unremarkable with no concerns. Office number provided to call us back with questions.

## 2022-01-08 NOTE — Telephone Encounter (Signed)
-----   Message from Suanne Marker, MD sent at 01/08/2022 11:12 AM EST ----- Unremarkable labs. -VRP

## 2022-05-07 ENCOUNTER — Other Ambulatory Visit (HOSPITAL_COMMUNITY): Payer: Self-pay | Admitting: Psychiatry

## 2022-05-07 DIAGNOSIS — F411 Generalized anxiety disorder: Secondary | ICD-10-CM

## 2022-05-07 DIAGNOSIS — F330 Major depressive disorder, recurrent, mild: Secondary | ICD-10-CM

## 2022-05-07 DIAGNOSIS — F3181 Bipolar II disorder: Secondary | ICD-10-CM

## 2022-05-29 ENCOUNTER — Other Ambulatory Visit (HOSPITAL_COMMUNITY): Payer: Self-pay | Admitting: Psychiatry

## 2022-05-29 DIAGNOSIS — F411 Generalized anxiety disorder: Secondary | ICD-10-CM

## 2022-05-29 DIAGNOSIS — F330 Major depressive disorder, recurrent, mild: Secondary | ICD-10-CM

## 2022-05-29 DIAGNOSIS — F3181 Bipolar II disorder: Secondary | ICD-10-CM

## 2022-05-29 NOTE — Telephone Encounter (Signed)
Meds refilled today. Patient will need future appointment for further refills.

## 2022-07-18 ENCOUNTER — Institutional Professional Consult (permissible substitution): Payer: Medicare Other | Admitting: Diagnostic Neuroimaging

## 2022-07-18 ENCOUNTER — Telehealth: Payer: Self-pay | Admitting: Diagnostic Neuroimaging

## 2022-07-18 NOTE — Telephone Encounter (Signed)
noted 

## 2022-07-18 NOTE — Telephone Encounter (Signed)
Pt cancelled due to leg pain and public transportation would be late.

## 2022-09-04 ENCOUNTER — Other Ambulatory Visit (HOSPITAL_COMMUNITY): Payer: Self-pay | Admitting: Psychiatry

## 2022-09-04 ENCOUNTER — Encounter: Payer: Self-pay | Admitting: Diagnostic Neuroimaging

## 2022-09-04 ENCOUNTER — Ambulatory Visit (INDEPENDENT_AMBULATORY_CARE_PROVIDER_SITE_OTHER): Payer: Medicare Other | Admitting: Diagnostic Neuroimaging

## 2022-09-04 VITALS — BP 145/95 | HR 79 | Ht 68.0 in | Wt 150.0 lb

## 2022-09-04 DIAGNOSIS — R200 Anesthesia of skin: Secondary | ICD-10-CM

## 2022-09-04 DIAGNOSIS — F3181 Bipolar II disorder: Secondary | ICD-10-CM

## 2022-09-04 DIAGNOSIS — M79605 Pain in left leg: Secondary | ICD-10-CM

## 2022-09-04 DIAGNOSIS — F330 Major depressive disorder, recurrent, mild: Secondary | ICD-10-CM

## 2022-09-04 DIAGNOSIS — M79604 Pain in right leg: Secondary | ICD-10-CM

## 2022-09-04 DIAGNOSIS — F411 Generalized anxiety disorder: Secondary | ICD-10-CM

## 2022-09-04 NOTE — Patient Instructions (Addendum)
LEG PAIN (aching, sharp pain; worse with activity; joint pain; suspect musculoskeletal cause of pain; some mild neuropathy from diabetes; extensive neuropathy workup otherwise negative)  - check MRI brain (rule out demyelinating disease) and MRI lumbar spine (rule out spinal stenosis)  - could not tolerate gabapentin (not effective); could consider amitriptyline  - optimize nutrition, sleep, exercise, stress mgmt  - follow up with PCP; consider sports medicine, rheumatology or pain clinic referral

## 2022-09-04 NOTE — Progress Notes (Signed)
[facility]  PATIENT: Diana Mendez DOB: [birth year]  REFERRING CLINICIAN: Gustavus Bryant, PA-C HISTORY FROM: patient  REASON FOR VISIT: follow up   HISTORICAL  CHIEF COMPLAINT:  Chief Complaint  Patient presents with   Pain    Rm 7 alone Pt is well, here due to previous pain being in feet has went up to her legs     HISTORY OF PRESENT ILLNESS:   UPDATE (09/04/22, VRP): Since last visit, continues with pain in low back, knees, feet. Poor sleep, more anxiety and stress.   PRIOR HPI (Jan 5312): 61 year old female here for evaluation of diabetic neuropathy.  Patient diagnosed with diabetes around 2009.  Since 2018 having significant numbness, pain, shooting sensations in the toes, feet and calves.  She has recently tried on gabapentin 300mg  at bedtime without relief.  She takes this medication sporadically and as needed.  She is concerned about taking higher dose of medication due to side effects of sedation.  A1c's have improved over time and recent level was 6.3 in July 2022.    REVIEW OF SYSTEMS: Full 14 system review of systems performed and negative with exception of: as per HPI.  ALLERGIES: Allergies  Allergen Reactions   Flexeril [Cyclobenzaprine] Anaphylaxis   Lisinopril Diarrhea    HOME MEDICATIONS: Outpatient Medications Prior to Visit  Medication Sig Dispense Refill   albuterol (VENTOLIN HFA) 108 (90 Base) MCG/ACT inhaler Inhale 1 puff into the lungs in the morning and at bedtime.     amLODipine (NORVASC) 5 MG tablet Take 1 tablet (5 mg total) by mouth daily. 30 tablet 0   ARIPiprazole (ABILIFY) 5 MG tablet TAKE ONE TABLET BY MOUTH DAILY AT 9AM 30 tablet 3   escitalopram (LEXAPRO) 10 MG tablet TAKE ONE TABLET BY MOUTH DAILY AT 9AM 30 tablet 11   fluticasone furoate-vilanterol (BREO ELLIPTA) 200-25 MCG/INH AEPB Inhale 1 puff into the lungs daily.     GABAPENTIN PO Take by mouth.     hydrOXYzine (ATARAX) 10 MG tablet TAKE ONE TABLET BY  MOUTH THREE TIMES DAILY AS NEEDED (VIAL) 90 tablet 11   metFORMIN (GLUCOPHAGE) 500 MG tablet Take 500 mg by mouth 2 (two) times daily with a meal.     rosuvastatin (CRESTOR) 10 MG tablet Take 10 mg by mouth at bedtime.     traZODone (DESYREL) 100 MG tablet TAKE 1 TABLET(100 MG) BY MOUTH AT BEDTIME (Patient taking differently: 150 mg.) 30 tablet 2   traZODone (DESYREL) 150 MG tablet Take 1 tablet (150 mg total) by mouth at bedtime. (Patient not taking: Reported on 09/04/2022) 30 tablet 3   No facility-administered medications prior to visit.    PAST MEDICAL HISTORY: Past Medical History:  Diagnosis Date   Anxiety    Asthma    Depression    Diabetes mellitus without complication (HCC)    High cholesterol    Hypertension     PAST SURGICAL HISTORY: Past Surgical History:  Procedure Laterality Date   ABDOMINAL HYSTERECTOMY     partial   BUNIONECTOMY     cervical disectomy     CHOLECYSTECTOMY     TUBAL LIGATION      FAMILY HISTORY: Family History  Problem Relation Age of Onset   Hypertension Mother    Diabetes Mother    Leukemia Daughter    Lymphoma Son    Leukemia Granddaughter    Neuropathy Neg Hx     SOCIAL HISTORY: Social History   Socioeconomic History   Marital status:  Single    Spouse name: Not on file   Number of children: Not on file   Years of education: Not on file   Highest education level: Not on file  Occupational History   Not on file  Tobacco Use   Smoking status: Former    Packs/day: 0.10    Types: Cigarettes    Quit date: 1    Years since quitting: 38.7   Smokeless tobacco: Never   Tobacco comments:    Smoked while drinking   Vaping Use   Vaping Use: Never used  Substance and Sexual Activity   Alcohol use: Never   Drug use: Never   Sexual activity: Yes  Other Topics Concern   Not on file  Social History Narrative   Lives alone    Right handed   Caffeine: 1 cup/day   Social Determinants of Health   Financial Resource Strain:  Not on file  Food Insecurity: Not on file  Transportation Needs: Not on file  Physical Activity: Not on file  Stress: Not on file  Social Connections: Not on file  Intimate Partner Violence: Not on file     PHYSICAL EXAM  GENERAL EXAM/CONSTITUTIONAL: Vitals:  Vitals:   09/04/22 1013  BP: (!) 145/95  Pulse: 79  Weight: 150 lb (68 kg)  Height: 5\' 8"  (1.727 m)   Body mass index is 22.81 kg/m. Wt Readings from Last 3 Encounters:  09/04/22 150 lb (68 kg)  12/28/21 156 lb (70.8 kg)  11/04/20 153 lb (69.4 kg)   Patient is RESTLESS; well developed, nourished and groomed; neck is supple  CARDIOVASCULAR: Examination of carotid arteries is normal; no carotid bruits Regular rate and rhythm, no murmurs Examination of peripheral vascular system by observation and palpation is normal  EYES: Ophthalmoscopic exam of optic discs and posterior segments is normal; no papilledema or hemorrhages No results found.  MUSCULOSKELETAL: Gait, strength, tone, movements noted in Neurologic exam below  NEUROLOGIC: MENTAL STATUS:      No data to display         awake, alert, oriented to person, place and time recent and remote memory intact normal attention and concentration language fluent, comprehension intact, naming intact fund of knowledge appropriate  CRANIAL NERVE:  2nd - no papilledema on fundoscopic exam 2nd, 3rd, 4th, 6th - pupils equal and reactive to light, visual fields full to confrontation, extraocular muscles intact, no nystagmus 5th - facial sensation symmetric 7th - facial strength symmetric 8th - hearing intact 9th - palate elevates symmetrically, uvula midline 11th - shoulder shrug symmetric 12th - tongue protrusion midline  MOTOR:  normal bulk and tone, full strength in the BUE, BLE  SENSORY:  normal and symmetric to light touch, pinprick, temperature, vibration  COORDINATION:  finger-nose-finger, fine finger movements normal  REFLEXES:  deep tendon  reflexes TRACE and symmetric  GAIT/STATION:  narrow based gait; ANTALGIC GAIT     DIAGNOSTIC DATA (LABS, IMAGING, TESTING) - I reviewed patient records, labs, notes, testing and imaging myself where available.  Lab Results  Component Value Date   WBC 5.2 12/28/2021   HGB 13.1 12/28/2021   HCT 38.2 12/28/2021   MCV 83 12/28/2021   PLT 310 12/28/2021      Component Value Date/Time   NA 141 12/28/2021 1029   K 3.9 12/28/2021 1029   CL 101 12/28/2021 1029   CO2 23 12/28/2021 1029   GLUCOSE 145 (H) 12/28/2021 1029   GLUCOSE 97 07/08/2021 1152   BUN 20 12/28/2021 1029  CREATININE 1.02 (H) 12/28/2021 1029   CALCIUM 10.2 12/28/2021 1029   PROT 7.8 12/28/2021 1029   ALBUMIN 4.7 12/28/2021 1029   AST 20 12/28/2021 1029   ALT 18 12/28/2021 1029   ALKPHOS 95 12/28/2021 1029   BILITOT 0.3 12/28/2021 1029   GFRNONAA >60 07/08/2021 1152   GFRAA >60 07/21/2020 1330   Lab Results  Component Value Date   CHOL 193 03/05/2020   HDL 48 03/05/2020   LDLCALC 121 (H) 03/05/2020   TRIG 118 03/05/2020   CHOLHDL 4.0 03/05/2020   Lab Results  Component Value Date   HGBA1C 6.6 (H) 12/28/2021   Lab Results  Component Value Date   [id number] 12/28/2021   Lab Results  Component Value Date   TSH 2.740 12/28/2021    12/31/21 neuropathy panel labs --> negative   ASSESSMENT AND PLAN  61 y.o. year old female here with lower extremity numbness, pain and tingling since 2018, likely due to diabetic neuropathy.  Will check labs to rule out other secondary causes.  Advised to gradually increase gabapentin to help with neuropathic pain control.  Other treatment options were reviewed with patient would like to hold off for now.  Dx:  1. Pain in both lower extremities   2. Numbness      PLAN:  LEG PAIN (aching, sharp pain; worse with activity; joint pain; suspect musculoskeletal cause of pain; some mild neuropathy from diabetes; extensive neuropathy workup otherwise negative) -  check MRI brain (rule out demyelinating disease) and MRI lumbar spine (rule out spinal stenosis) - could not tolerate gabapentin (not effective); could consider amitriptyline - optimize nutrition, sleep, exercise, stress mgmt - follow up with PCP; consider sports medicine, rheumatology or pain clinic referral  Orders Placed This Encounter  Procedures   MR BRAIN W WO CONTRAST   MR [place]   Return for pending if symptoms worsen or fail to improve, pending test results.    Penni Bombard, MD [id number], [id number] AM Certified in Neurology, Neurophysiology and Neuroimaging  [facility] [address], [place], [place] [address] [phone]

## 2022-09-05 ENCOUNTER — Telehealth: Payer: Self-pay | Admitting: Diagnostic Neuroimaging

## 2022-09-05 NOTE — Telephone Encounter (Signed)
[employer] medicare/[place] medicaid NPR sent to GI 

## 2022-09-06 ENCOUNTER — Telehealth: Payer: Self-pay | Admitting: Diagnostic Neuroimaging

## 2022-09-06 NOTE — Telephone Encounter (Signed)
Pt said, [facility] informed her MRI would be closed in,  Pt said she is claustrophobic and have anxiety. GI recommended get MRI at [facility] if need to be sedated. Would like a call from the nurse.

## 2022-09-09 NOTE — Telephone Encounter (Signed)
Called patient and informed her the MD typically will prescribe xanax to take before MRI. She'll need a driver. She stated she rides the bus. She will call GI and schedule MRI then reach out to friend to accompany her.  I advised she call us back when she has appointments and someone to accompany her.  the Rx can be sent in at that time. Patient verbalized understanding, appreciation.

## 2022-09-22 ENCOUNTER — Other Ambulatory Visit: Payer: Self-pay

## 2022-09-22 ENCOUNTER — Inpatient Hospital Stay: Admission: RE | Admit: 2022-09-22 | Payer: Medicare Other | Source: Ambulatory Visit

## 2022-09-27 ENCOUNTER — Other Ambulatory Visit (HOSPITAL_COMMUNITY): Payer: Self-pay | Admitting: Psychiatry

## 2022-09-27 DIAGNOSIS — F3181 Bipolar II disorder: Secondary | ICD-10-CM

## 2022-10-08 ENCOUNTER — Other Ambulatory Visit: Payer: Medicare Other

## 2023-01-03 ENCOUNTER — Other Ambulatory Visit (HOSPITAL_COMMUNITY): Payer: Self-pay | Admitting: Psychiatry

## 2023-01-03 DIAGNOSIS — F3181 Bipolar II disorder: Secondary | ICD-10-CM

## 2023-05-02 ENCOUNTER — Other Ambulatory Visit: Payer: Self-pay | Admitting: Nurse Practitioner

## 2023-05-02 DIAGNOSIS — G629 Polyneuropathy, unspecified: Secondary | ICD-10-CM

## 2023-05-02 DIAGNOSIS — I999 Unspecified disorder of circulatory system: Secondary | ICD-10-CM

## 2023-05-13 ENCOUNTER — Other Ambulatory Visit: Payer: Self-pay | Admitting: *Deleted

## 2023-05-13 DIAGNOSIS — I8393 Asymptomatic varicose veins of bilateral lower extremities: Secondary | ICD-10-CM

## 2023-05-20 ENCOUNTER — Ambulatory Visit (HOSPITAL_COMMUNITY)
Admission: RE | Admit: 2023-05-20 | Discharge: 2023-05-20 | Disposition: A | Discharge status: Home / Self Care | Payer: Medicare HMO | Source: Ambulatory Visit | Attending: Vascular Surgery | Admitting: Vascular Surgery

## 2023-05-20 DIAGNOSIS — I8393 Asymptomatic varicose veins of bilateral lower extremities: Secondary | ICD-10-CM

## 2023-05-22 ENCOUNTER — Ambulatory Visit (INDEPENDENT_AMBULATORY_CARE_PROVIDER_SITE_OTHER): Payer: Medicare HMO | Admitting: Physician Assistant

## 2023-05-22 VITALS — BP 185/103 | HR 75 | Temp 98.4°F | Resp 20 | Ht 68.0 in | Wt 148.0 lb

## 2023-05-22 DIAGNOSIS — I872 Venous insufficiency (chronic) (peripheral): Secondary | ICD-10-CM | POA: Diagnosis not present

## 2023-05-22 DIAGNOSIS — I781 Nevus, non-neoplastic: Secondary | ICD-10-CM

## 2023-05-22 NOTE — Progress Notes (Signed)
Office Note     CC:  follow up Requesting Provider:  Loura Back, NP  HPI: Diana Mendez is a 62 y.o. ([birth year]) female who presents for evaluation of left pain as well as spider veins.  She has noticed worsening left leg pain starting in her buttock and hip extending down to her lateral lower leg over the past several months.  No history of spine surgery however has history of lower back pain.  No recognizable pattern to the pain however it can occur after laying down or standing for periods of time.  She also complains of surface veins especially in her left anterior and posterior thigh.  After each of her 8 pregnancies she states the veins became more apparent.  She denies any significant edema of the left leg.  When she lived in [place] she underwent ablation procedures of bilateral lower extremities.  She does not wear compression.  She also denies any history of DVT or venous ulcerations.  She is a former smoker.  She works at [employer].  PMH also significant for diabetes mellitus.   Past Medical History:  Diagnosis Date   Anxiety    Asthma    Depression    Diabetes mellitus without complication (HCC)    High cholesterol    Hypertension     Past Surgical History:  Procedure Laterality Date   ABDOMINAL HYSTERECTOMY     partial   BUNIONECTOMY     cervical disectomy     CHOLECYSTECTOMY     TUBAL LIGATION      Social History   Socioeconomic History   Marital status: Single    Spouse name: Not on file   Number of children: Not on file   Years of education: Not on file   Highest education level: Not on file  Occupational History   Not on file  Tobacco Use   Smoking status: Former    Packs/day: .1    Types: Cigarettes    Quit date: 39    Years since quitting: 39.4   Smokeless tobacco: Never   Tobacco comments:    Smoked while drinking   Vaping Use   Vaping Use: Never used  Substance and Sexual Activity   Alcohol use: Never   Drug use: Never   Sexual  activity: Yes  Other Topics Concern   Not on file  Social History Narrative   Lives alone    Right handed   Caffeine: 1 cup/day   Social Determinants of Health   Financial Resource Strain: Not on file  Food Insecurity: Not on file  Transportation Needs: Not on file  Physical Activity: Not on file  Stress: Not on file  Social Connections: Not on file  Intimate Partner Violence: Not on file    Family History  Problem Relation Age of Onset   Hypertension Mother    Diabetes Mother    Leukemia Daughter    Lymphoma Son    Leukemia Granddaughter    Neuropathy Neg Hx     Current Outpatient Medications  Medication Sig Dispense Refill   albuterol (VENTOLIN HFA) 108 (90 Base) MCG/ACT inhaler Inhale 1 puff into the lungs in the morning and at bedtime.     ARIPiprazole (ABILIFY) 5 MG tablet TAKE ONE TABLET BY MOUTH DAILY AT 9AM 30 tablet 11   escitalopram (LEXAPRO) 10 MG tablet TAKE ONE TABLET BY MOUTH DAILY AT 9AM 30 tablet 11   fluticasone furoate-vilanterol (BREO ELLIPTA) 200-25 MCG/INH AEPB Inhale 1 puff into the  lungs daily.     GABAPENTIN PO Take by mouth.     hydrOXYzine (ATARAX) 10 MG tablet TAKE ONE TABLET BY MOUTH THREE TIMES DAILY AS NEEDED (VIAL) 90 tablet 11   metFORMIN (GLUCOPHAGE) 500 MG tablet Take 500 mg by mouth 2 (two) times daily with a meal.     rosuvastatin (CRESTOR) 10 MG tablet Take 10 mg by mouth at bedtime.     traZODone (DESYREL) 100 MG tablet TAKE 1 TABLET(100 MG) BY MOUTH AT BEDTIME (Patient taking differently: 150 mg.) 30 tablet 2   amLODipine (NORVASC) 5 MG tablet Take 1 tablet (5 mg total) by mouth daily. 30 tablet 0   No current facility-administered medications for this visit.    Allergies  Allergen Reactions   Flexeril [Cyclobenzaprine] Anaphylaxis   Lisinopril Diarrhea     REVIEW OF SYSTEMS:   [X]  denotes positive finding, [ ]  denotes negative finding Cardiac  Comments:  Chest pain or chest pressure:    Shortness of breath upon exertion:     Short of breath when lying flat:    Irregular heart rhythm:        Vascular    Pain in calf, thigh, or hip brought on by ambulation:    Pain in feet at night that wakes you up from your sleep:     Blood clot in your veins:    Leg swelling:         Pulmonary    Oxygen at home:    Productive cough:     Wheezing:         Neurologic    Sudden weakness in arms or legs:     Sudden numbness in arms or legs:     Sudden onset of difficulty speaking or slurred speech:    Temporary loss of vision in one eye:     Problems with dizziness:         Gastrointestinal    Blood in stool:     Vomited blood:         Genitourinary    Burning when urinating:     Blood in urine:        Psychiatric    Major depression:         Hematologic    Bleeding problems:    Problems with blood clotting too easily:        Skin    Rashes or ulcers:        Constitutional    Fever or chills:      PHYSICAL EXAMINATION:  Vitals:   05/22/23 0833  BP: (!) 185/103  Pulse: 75  Resp: 20  Temp: 98.4 F (36.9 C)  SpO2: 99%  Weight: 148 lb (67.1 kg)  Height: 5\' 8"  (1.727 m)    General:  WDWN in NAD; vital signs documented above Gait: Not observed HENT: WNL, normocephalic Pulmonary: normal non-labored breathing , without Rales, rhonchi,  wheezing Cardiac: regular HR Abdomen: soft, NT, no masses Skin: without rashes Vascular Exam/Pulses: palpable DP pulses BLE Extremities: without ischemic changes, without Gangrene , without cellulitis; without open wounds; spider veins of L ant and post thigh and behind L knee Musculoskeletal: no muscle wasting or atrophy  Neurologic: A&O X 3 Psychiatric:  The pt has Normal affect.   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  female here for follow up for evaluation of left leg pain and spider veins  -[name] is a 62 year old female complaining of pain that radiates from her left buttock and hip down into her lower lateral leg.  Pain occurs when laying flat or when standing for periods of time.  Left lower extremity venous reflux study was negative for DVT.  Findings also consistent with prior laser ablation of the GSV.  She does have spider veins of her thigh however this would not cause her pain as described above.  She likely has some component of sciatic pain.  I recommended stretching exercises as well as a heat pack and anti-inflammatories.  She will follow-up with her PCP if the pain persists or becomes worse.  Sclerotherapy is currently not in her budget however patient states she may call our office at the end of the year to have sclerotherapy of the left lower extremity if she is able to afford it.  In the meantime she can wear knee-high 15 to 20 mmHg compression.  She can also elevate her legs above the level of her heart periodically throughout the day.  She should also avoid prolonged sitting and standing when possible.  Patient can otherwise follow-up on an as-needed basis.   Emilie Rutter, PA-C [facility] [phone]  Clinic MD:   Randie Heinz

## 2023-05-27 ENCOUNTER — Encounter (HOSPITAL_COMMUNITY): Payer: Self-pay

## 2023-05-27 ENCOUNTER — Emergency Department (HOSPITAL_COMMUNITY): Payer: Medicare HMO

## 2023-05-27 ENCOUNTER — Emergency Department (HOSPITAL_COMMUNITY)
Admission: EM | Admit: 2023-05-27 | Discharge: 2023-05-27 | Disposition: A | Discharge status: Home / Self Care | Payer: Medicare HMO | Attending: Emergency Medicine | Admitting: Emergency Medicine

## 2023-05-27 ENCOUNTER — Other Ambulatory Visit: Payer: Self-pay

## 2023-05-27 DIAGNOSIS — I10 Essential (primary) hypertension: Secondary | ICD-10-CM | POA: Diagnosis not present

## 2023-05-27 DIAGNOSIS — Z7951 Long term (current) use of inhaled steroids: Secondary | ICD-10-CM | POA: Diagnosis not present

## 2023-05-27 DIAGNOSIS — Z7984 Long term (current) use of oral hypoglycemic drugs: Secondary | ICD-10-CM | POA: Insufficient documentation

## 2023-05-27 DIAGNOSIS — R519 Headache, unspecified: Secondary | ICD-10-CM | POA: Diagnosis present

## 2023-05-27 DIAGNOSIS — J45909 Unspecified asthma, uncomplicated: Secondary | ICD-10-CM | POA: Insufficient documentation

## 2023-05-27 DIAGNOSIS — Z79899 Other long term (current) drug therapy: Secondary | ICD-10-CM | POA: Diagnosis not present

## 2023-05-27 DIAGNOSIS — I160 Hypertensive urgency: Secondary | ICD-10-CM | POA: Insufficient documentation

## 2023-05-27 DIAGNOSIS — E119 Type 2 diabetes mellitus without complications: Secondary | ICD-10-CM | POA: Insufficient documentation

## 2023-05-27 LAB — CBC
HCT: 36.5 % (ref 36.0–46.0)
Hemoglobin: 11.6 g/dL — ABNORMAL LOW (ref 12.0–15.0)
MCH: 27.4 pg (ref 26.0–34.0)
MCHC: 31.8 g/dL (ref 30.0–36.0)
MCV: 86.3 fL (ref 80.0–100.0)
Platelets: 288 10*3/uL (ref 150–400)
RBC: 4.23 MIL/uL (ref 3.87–5.11)
RDW: 13.4 % (ref 11.5–15.5)
WBC: 6.5 10*3/uL (ref 4.0–10.5)
nRBC: 0 % (ref 0.0–0.2)

## 2023-05-27 LAB — BASIC METABOLIC PANEL
Anion gap: 10 (ref 5–15)
BUN: 13 mg/dL (ref 8–23)
CO2: 24 mmol/L (ref 22–32)
Calcium: 8.9 mg/dL (ref 8.9–10.3)
Chloride: 105 mmol/L (ref 98–111)
Creatinine, Ser: 0.83 mg/dL (ref 0.44–1.00)
GFR, Estimated: >60 mL/min (ref >60)
Glucose, Bld: 101 mg/dL — ABNORMAL HIGH (ref 70–99)
Potassium: 3.8 mmol/L (ref 3.5–5.1)
Sodium: 139 mmol/L (ref 135–145)

## 2023-05-27 LAB — TROPONIN I (HIGH SENSITIVITY): Troponin I (High Sensitivity): 9 ng/L (ref <18)

## 2023-05-27 MED ORDER — HYDRALAZINE HCL 10 MG PO TABS: 10.0000 mg | ORAL_TABLET | Freq: Two times a day (BID) | ORAL | 0 refills | Status: DC

## 2023-05-27 MED ORDER — ACETAMINOPHEN 325 MG PO TABS
325.0000 mg | ORAL_TABLET | Freq: Once | ORAL | Status: AC
  Administered 2023-05-27: 325 mg via ORAL
  Filled 2023-05-27: qty 1

## 2023-05-27 MED ORDER — HYDRALAZINE HCL 20 MG/ML IJ SOLN
5.0000 mg | Freq: Once | INTRAMUSCULAR | Status: AC
  Administered 2023-05-27: 5 mg via INTRAMUSCULAR
  Filled 2023-05-27 (×2): qty 1

## 2023-05-27 NOTE — ED Triage Notes (Signed)
Pt c/o dizziness, weakness, chest pressure, headache, and hypertension x2 days.  Pain score 6/10.  Hx of anxiety and HTN.  Pt reports compliance w/ medications.

## 2023-05-27 NOTE — Discharge Instructions (Signed)
Please try to eat fresh vegetables and meat to decrease salt that is seen in frozen and canned foods.  Follow-up with your primary care physician in the next 1 to 2 weeks to recheck medication and its blood pressure effects.

## 2023-05-27 NOTE — ED Provider Notes (Signed)
[place] EMERGENCY DEPARTMENT AT [facility] Provider Note   CSN: [id number] Arrival date & time: 05/27/23  1423     History  Chief Complaint  Patient presents with   Chest Pain   Weakness   Hypertension    Diana Mendez is a 62 y.o. female.  Patient is a 62 yo female with pmh of htn, hyperlipidemia, asthma, and diabetes on metformin no insulin presenting for chest pressure. Patient admits to chest pressure that started on Sunday. No sob. No fevers, chills, nausea, vomiting, or coughing. Admits to associated elevated blood pressure readings, light headedness, and generalized weakness. Highest BP reading at home 188/. States she compliant with home amlodipine. Admits to chronically struggling with elevated bp readings. Has decreased overall salt intake. Does not eat out. Eats a lot of frozen food. Trying to cut out canned soup.   BP 196/113 on arrival.   The history is provided by the patient. No language interpreter was used.  Chest Pain Associated symptoms: headache and weakness   Associated symptoms: no abdominal pain, no back pain, no cough, no fever, no palpitations, no shortness of breath and no vomiting   Weakness Associated symptoms: chest pain and headaches   Associated symptoms: no abdominal pain, no arthralgias, no cough, no dysuria, no fever, no seizures, no shortness of breath and no vomiting   Hypertension Associated symptoms include chest pain and headaches. Pertinent negatives include no abdominal pain and no shortness of breath.       Home Medications Prior to Admission medications   Medication Sig Start Date End Date Taking? Authorizing Provider  albuterol (VENTOLIN HFA) 108 (90 Base) MCG/ACT inhaler Inhale 1 puff into the lungs in the morning and at bedtime.    [provider]  amLODipine (NORVASC) 5 MG tablet Take 1 tablet (5 mg total) by mouth daily. 07/23/18 09/04/22  Curatolo, Adam, DO  ARIPiprazole (ABILIFY) 5 MG tablet TAKE ONE  TABLET BY MOUTH DAILY AT 9AM 01/03/23   Toy Cookey E, NP  escitalopram (LEXAPRO) 10 MG tablet TAKE ONE TABLET BY MOUTH DAILY AT 9AM 09/04/22   Toy Cookey E, NP  fluticasone furoate-vilanterol (BREO ELLIPTA) 200-25 MCG/INH AEPB Inhale 1 puff into the lungs daily.    [provider]  GABAPENTIN PO Take by mouth.    [provider]  hydrALAZINE (APRESOLINE) 10 MG tablet Take 1 tablet (10 mg total) by mouth in the morning and at bedtime. 05/27/23   Edwin Dada P, DO  hydrOXYzine (ATARAX) 10 MG tablet TAKE ONE TABLET BY MOUTH THREE TIMES DAILY AS NEEDED (VIAL) 09/04/22   Shanna Cisco, NP  metFORMIN (GLUCOPHAGE) 500 MG tablet Take 500 mg by mouth 2 (two) times daily with a meal.    [provider]  rosuvastatin (CRESTOR) 10 MG tablet Take 10 mg by mouth at bedtime.    [provider]  traZODone (DESYREL) 100 MG tablet TAKE 1 TABLET(100 MG) BY MOUTH AT BEDTIME Patient taking differently: 150 mg. 01/02/22   Toy Cookey E, NP  valsartan-hydrochlorothiazide (DIOVAN-HCT) 320-25 MG tablet Take 1 tablet by mouth daily. 03/17/20 11/20/20  [provider]      Allergies    Flexeril [cyclobenzaprine] and Lisinopril    Review of Systems   Review of Systems  Constitutional:  Negative for chills and fever.  HENT:  Negative for ear pain and sore throat.   Eyes:  Negative for pain and visual disturbance.  Respiratory:  Negative for cough and shortness of breath.  Cardiovascular:  Positive for chest pain. Negative for palpitations.  Gastrointestinal:  Negative for abdominal pain and vomiting.  Genitourinary:  Negative for dysuria and hematuria.  Musculoskeletal:  Negative for arthralgias and back pain.  Skin:  Negative for color change and rash.  Neurological:  Positive for weakness and headaches. Negative for seizures and syncope.  All other systems reviewed and are negative.   Physical Exam Updated Vital Signs BP (!) 194/116   Pulse 83    Temp 98.5 F (36.9 C) (Oral)   Resp (!) 21   Ht 5\' 8"  (1.727 m)   Wt 67.1 kg   SpO2 98%   BMI 22.50 kg/m  Physical Exam Vitals and nursing note reviewed.  Constitutional:      General: She is not in acute distress.    Appearance: She is well-developed.  HENT:     Head: Normocephalic and atraumatic.  Eyes:     Conjunctiva/sclera: Conjunctivae normal.  Cardiovascular:     Rate and Rhythm: Normal rate and regular rhythm.     Heart sounds: No murmur heard. Pulmonary:     Effort: Pulmonary effort is normal. No respiratory distress.     Breath sounds: Normal breath sounds.  Abdominal:     Palpations: Abdomen is soft.     Tenderness: There is no abdominal tenderness.  Musculoskeletal:        General: No swelling.     Cervical back: Neck supple.  Skin:    General: Skin is warm and dry.     Capillary Refill: Capillary refill takes less than 2 seconds.  Neurological:     Mental Status: She is alert and oriented to person, place, and time.     GCS: GCS eye subscore is 4. GCS verbal subscore is 5. GCS motor subscore is 6.     Cranial Nerves: No cranial nerve deficit.     Sensory: Sensation is intact.     Motor: Motor function is intact.     Coordination: Coordination is intact.     Gait: Gait is intact.  Psychiatric:        Mood and Affect: Mood normal.     ED Results / Procedures / Treatments   Labs (all labs ordered are listed, but only abnormal results are displayed) Labs Reviewed  BASIC METABOLIC PANEL - Abnormal; Notable for the following components:      Result Value   Glucose, Bld 101 (*)    All other components within normal limits  CBC - Abnormal; Notable for the following components:   Hemoglobin 11.6 (*)    All other components within normal limits  TROPONIN I (HIGH SENSITIVITY)  TROPONIN I (HIGH SENSITIVITY)    EKG EKG Interpretation  Date/Time:  Tuesday May 27 2023 14:51:36 EDT Ventricular Rate:  79 PR Interval:  152 QRS Duration: 68 QT  Interval:  398 QTC Calculation: 456 R Axis:   -11 Text Interpretation: Normal sinus rhythm Septal infarct , age undetermined Abnormal ECG When compared with ECG of 08-Jul-2021 11:53, PREVIOUS ECG IS PRESENT Confirmed by Edwin Dada ([id number]) on 05/27/2023 6:20:23 PM  Radiology DG Chest 2 View  Result Date: 05/27/2023 CLINICAL DATA:  Chest pain EXAM: CHEST - 2 VIEW COMPARISON:  X-ray 11/20/2020 FINDINGS: Enlarged cardiopericardial silhouette. Vascular congestion. No pneumothorax, effusion or edema. No consolidation. Surgical changes with hardware along the lower cervical spine. Surgical clips in the upper abdomen. Degenerative changes of the thoracic spine. IMPRESSION: Enlarged heart.  No acute cardiopulmonary disease. Electronically Signed   By:  Karen Kays M.D.   On: 05/27/2023 16:12    Procedures Procedures    Medications Ordered in ED Medications  acetaminophen (TYLENOL) tablet 325 mg (325 mg Oral Given 05/27/23 1853)  hydrALAZINE (APRESOLINE) injection 5 mg (5 mg Intramuscular Given 05/27/23 1855)    ED Course/ Medical Decision Making/ A&P                             Medical Decision Making Amount and/or Complexity of Data Reviewed Labs: ordered. Radiology: ordered.  Risk OTC drugs. Prescription drug management.   62 yo female with pmh of htn, hyperlipidemia, asthma, and diabetes on metformin no insulin presenting for chest pressure, headaches, generalized weakness, and associated elevated blood pressures.  Patient is alert and oriented x 3, no acute distress, afebrile, stable vital signs.  Patient has no neurovascular deficits on exam.  She is generally well-appearing.  EKG demonstrates sinus rhythm.  No ST segment elevation or depression.  Troponin stable's.  Electrolytes stable.  Already taking amlodipine.  No missed doses.  States she is trying to adhere to a low-sodium diet however she is eating a lot of frozen meals and canned meals.  I recommended switching to fresh  vegetables and fresh meat to decrease sodium levels.  She received IM hydralazine with improvement of blood pressure.  My goal was to get her under 180.  On discharge she is 150/80.  I recommend she follow closely with her primary care physician.  Discharge diagnosis is hypertensive urgency with no signs of endorgan failure.  With blood pressure improvement patient admits to complete resolution of headache and chest tightness.  Patient in no distress and overall condition improved here in the ED. Detailed discussions were had with the patient regarding current findings, and need for close f/u with PCP or on call doctor. The patient has been instructed to return immediately if the symptoms worsen in any way for re-evaluation. Patient verbalized understanding and is in agreement with current care plan. All questions answered prior to discharge.         Final Clinical Impression(s) / ED Diagnoses Final diagnoses:  Hypertensive urgency    Rx / DC Orders ED Discharge Orders          Ordered    hydrALAZINE (APRESOLINE) 10 MG tablet  2 times daily,   Status:  Discontinued        05/27/23 2016    hydrALAZINE (APRESOLINE) 10 MG tablet  2 times daily        05/27/23 2016              Franne Forts, DO 05/27/23 2017

## 2023-05-30 ENCOUNTER — Other Ambulatory Visit: Payer: Self-pay | Admitting: Student

## 2023-05-30 DIAGNOSIS — I10 Essential (primary) hypertension: Secondary | ICD-10-CM

## 2023-06-02 DIAGNOSIS — Z7984 Long term (current) use of oral hypoglycemic drugs: Secondary | ICD-10-CM | POA: Diagnosis not present

## 2023-06-02 DIAGNOSIS — I252 Old myocardial infarction: Secondary | ICD-10-CM | POA: Diagnosis not present

## 2023-06-02 DIAGNOSIS — E1151 Type 2 diabetes mellitus with diabetic peripheral angiopathy without gangrene: Secondary | ICD-10-CM | POA: Diagnosis not present

## 2023-06-02 DIAGNOSIS — R32 Unspecified urinary incontinence: Secondary | ICD-10-CM | POA: Diagnosis not present

## 2023-06-02 DIAGNOSIS — Z720 Tobacco use: Secondary | ICD-10-CM | POA: Diagnosis not present

## 2023-06-02 DIAGNOSIS — I951 Orthostatic hypotension: Secondary | ICD-10-CM | POA: Diagnosis not present

## 2023-06-02 DIAGNOSIS — F322 Major depressive disorder, single episode, severe without psychotic features: Secondary | ICD-10-CM | POA: Diagnosis not present

## 2023-06-02 DIAGNOSIS — E785 Hyperlipidemia, unspecified: Secondary | ICD-10-CM | POA: Diagnosis not present

## 2023-06-02 DIAGNOSIS — I10 Essential (primary) hypertension: Secondary | ICD-10-CM | POA: Diagnosis not present

## 2023-06-02 DIAGNOSIS — F419 Anxiety disorder, unspecified: Secondary | ICD-10-CM | POA: Diagnosis not present

## 2023-06-13 ENCOUNTER — Other Ambulatory Visit: Payer: Medicare HMO

## 2023-06-17 ENCOUNTER — Ambulatory Visit (INDEPENDENT_AMBULATORY_CARE_PROVIDER_SITE_OTHER): Payer: Medicare HMO | Admitting: Cardiology

## 2023-06-17 ENCOUNTER — Encounter (HOSPITAL_BASED_OUTPATIENT_CLINIC_OR_DEPARTMENT_OTHER): Payer: Self-pay | Admitting: Cardiology

## 2023-06-17 VITALS — BP 204/100 | HR 91 | Ht 68.0 in | Wt 149.4 lb

## 2023-06-17 DIAGNOSIS — I10 Essential (primary) hypertension: Secondary | ICD-10-CM | POA: Diagnosis not present

## 2023-06-17 DIAGNOSIS — R079 Chest pain, unspecified: Secondary | ICD-10-CM | POA: Diagnosis not present

## 2023-06-17 MED ORDER — LABETALOL HCL 200 MG PO TABS: 200.0000 mg | ORAL_TABLET | Freq: Two times a day (BID) | ORAL | 3 refills | Status: AC

## 2023-06-17 MED ORDER — AMLODIPINE BESYLATE 5 MG PO TABS: 5.0000 mg | ORAL_TABLET | Freq: Every day | ORAL | 3 refills | Status: AC

## 2023-06-17 NOTE — Patient Instructions (Signed)
Medication Instructions:  Please discontinue your Hydralazine. Start Amlodipine 5 mg once a day and Labetalol 200 mg one tablet twice a day. Continue all other medications as listed.    *If you need a refill on your cardiac medications before your next appointment, please call your pharmacy*  Lab Work: None If you have labs (blood work) drawn today and your tests are completely normal, you will receive your results only by: MyChart Message (if you have MyChart) OR A paper copy in the mail If you have any lab test that is abnormal or we need to change your treatment, we will call you to review the results.  You have been referred to the Hypertension Clinic at [facility].   Testing/Procedures: Your physician has requested that you have an echocardiogram. Echocardiography is a painless test that uses sound waves to create images of your heart. It provides your doctor with information about the size and shape of your heart and how well your heart's chambers and valves are working. This procedure takes approximately one hour. There are no restrictions for this procedure. Please do NOT wear cologne, perfume, aftershave, or lotions (deodorant is allowed). Please arrive 15 minutes prior to your appointment time.  Follow-Up: At [facility], you and your health needs are our priority.  As part of our continuing mission to provide you with exceptional heart care, we have created designated Provider Care Teams.  These Care Teams include your primary Cardiologist (physician) and Advanced Practice Providers (APPs -  Physician Assistants and Nurse Practitioners) who all work together to provide you with the care you need, when you need it.  We recommend signing up for the patient portal called "MyChart".  Sign up information is provided on this After Visit Summary.  MyChart is used to connect with patients for Virtual Visits (Telemedicine).  Patients are able to view lab/test results,  encounter notes, upcoming appointments, etc.  Non-urgent messages can be sent to your provider as well.   To learn more about what you can do with MyChart, go to [email or web address].    Your next appointment:   Follow up after being seen in Hypertension Clinic at [facility] office.

## 2023-06-17 NOTE — Progress Notes (Signed)
Cardiology Office Note:    Date:  06/17/2023   ID:  Diana Mendez, DOB [birth year], MRN [id number]  PCP:  Loura Back, NP   [facility] Providers Cardiologist:  None     Referring MD: Loura Back, NP    History of Present Illness:    Diana Mendez is a 62 y.o. female here for the evaluation of hypertension at the request of Dr. Cyndie Chime.  [employer]. Stock cart, felt spinning. Feels chest heaviness. BP 200 in ER.  Has been in the ER many times.  Had BP meds changed so many times.   Father MI  GM MI  No tob   Past Medical History:  Diagnosis Date   Anxiety    Asthma    Depression    Diabetes mellitus without complication (HCC)    High cholesterol    Hypertension     Past Surgical History:  Procedure Laterality Date   ABDOMINAL HYSTERECTOMY     partial   BUNIONECTOMY     cervical disectomy     CHOLECYSTECTOMY     TUBAL LIGATION      Current Medications: Current Meds  Medication Sig   albuterol (VENTOLIN HFA) 108 (90 Base) MCG/ACT inhaler Inhale 1 puff into the lungs in the morning and at bedtime.   amLODipine (NORVASC) 5 MG tablet Take 1 tablet (5 mg total) by mouth daily.   ARIPiprazole (ABILIFY) 5 MG tablet TAKE ONE TABLET BY MOUTH DAILY AT 9AM   escitalopram (LEXAPRO) 10 MG tablet TAKE ONE TABLET BY MOUTH DAILY AT 9AM   fluticasone furoate-vilanterol (BREO ELLIPTA) 200-25 MCG/INH AEPB Inhale 1 puff into the lungs daily.   GABAPENTIN PO Take by mouth.   hydrOXYzine (ATARAX) 10 MG tablet TAKE ONE TABLET BY MOUTH THREE TIMES DAILY AS NEEDED (VIAL)   labetalol (NORMODYNE) 200 MG tablet Take 1 tablet (200 mg total) by mouth 2 (two) times daily.   metFORMIN (GLUCOPHAGE) 500 MG tablet Take 500 mg by mouth 2 (two) times daily with a meal.   rosuvastatin (CRESTOR) 10 MG tablet Take 10 mg by mouth at bedtime.   traZODone (DESYREL) 100 MG tablet TAKE 1 TABLET(100 MG) BY MOUTH AT BEDTIME (Patient taking differently: 150 mg.)   [DISCONTINUED] amLODipine  (NORVASC) 5 MG tablet Take 1 tablet (5 mg total) by mouth daily.   [DISCONTINUED] hydrALAZINE (APRESOLINE) 10 MG tablet Take 1 tablet (10 mg total) by mouth in the morning and at bedtime.     Allergies:   Flexeril [cyclobenzaprine] and Lisinopril   Social History   Socioeconomic History   Marital status: Single    Spouse name: Not on file   Number of children: Not on file   Years of education: Not on file   Highest education level: Not on file  Occupational History   Not on file  Tobacco Use   Smoking status: Former    Packs/day: .1    Types: Cigarettes    Quit date: 100    Years since quitting: 39.5   Smokeless tobacco: Never   Tobacco comments:    Smoked while drinking   Vaping Use   Vaping Use: Never used  Substance and Sexual Activity   Alcohol use: Never   Drug use: Never   Sexual activity: Yes  Other Topics Concern   Not on file  Social History Narrative   Lives alone    Right handed   Caffeine: 1 cup/day   Social Determinants of Health   Financial Resource Strain:  Not on file  Food Insecurity: Not on file  Transportation Needs: Not on file  Physical Activity: Not on file  Stress: Not on file  Social Connections: Not on file     Family History: The patient's family history includes Diabetes in her mother; Hypertension in her mother; Leukemia in her daughter and granddaughter; Lymphoma in her son. There is no history of Neuropathy.  ROS:   Please see the history of present illness.    No syncope, high anxiety all other systems reviewed and are negative.  EKGs/Labs/Other Studies Reviewed:    The following studies were reviewed today: ER notes reviewed, EKGs reviewed  EKG: Sinus rhythm no ischemic changes  Recent Labs: 05/27/2023: BUN 13; Creatinine, Ser 0.83; Hemoglobin 11.6; Platelets 288; Potassium 3.8; Sodium 139  Recent Lipid Panel    Component Value Date/Time   CHOL 193 03/05/2020 0346   TRIG 118 03/05/2020 0346   HDL 48 03/05/2020 0346    CHOLHDL 4.0 03/05/2020 0346   VLDL 24 03/05/2020 0346   LDLCALC 121 (H) 03/05/2020 0346     Risk Assessment/Calculations:              Physical Exam:    VS:  BP (!) 204/100 (BP Location: Right Arm, Patient Position: Sitting, Cuff Size: Normal)   Pulse 91   Ht 5\' 8"  (1.727 m)   Wt 149 lb 6.4 oz (67.8 kg)   BMI 22.72 kg/m     Wt Readings from Last 3 Encounters:  06/17/23 149 lb 6.4 oz (67.8 kg)  05/27/23 148 lb (67.1 kg)  05/22/23 148 lb (67.1 kg)     GEN:  Well nourished, well developed in no acute distress HEENT: Normal NECK: No JVD; No carotid bruits LYMPHATICS: No lymphadenopathy CARDIAC: RRR, no murmurs, rubs, gallops RESPIRATORY:  Clear to auscultation without rales, wheezing or rhonchi  ABDOMEN: Soft, non-tender, non-distended MUSCULOSKELETAL:  No edema; No deformity  SKIN: Warm and dry NEUROLOGIC:  Alert and oriented x 3 PSYCHIATRIC:  Normal affect, anxious  ASSESSMENT:    1. Hypertension, unspecified type   2. Chest pain of uncertain etiology    PLAN:    In order of problems listed above:  Primary hypertension - Glad she is seeking attention for this. - Start amlodipine 5 mg once a day - Start labetalol 200 mg twice a day - Lisinopril caused diarrhea - Stop hydralazine 10 mg twice a day - Hypertension clinic follow-up.  Chest heaviness - Thankfully troponins have been negative in the ER.  No ischemic changes.  Hopefully his blood pressure continues to improve this will improve as well.  No evidence of myocardial injury. -Echocardiogram             Medication Adjustments/Labs and Tests Ordered: Current medicines are reviewed at length with the patient today.  Concerns regarding medicines are outlined above.  Orders Placed This Encounter  Procedures   AMB Referral to Heartcare Pharm-D   ECHOCARDIOGRAM COMPLETE   Meds ordered this encounter  Medications   amLODipine (NORVASC) 5 MG tablet    Sig: Take 1 tablet (5 mg total) by mouth  daily.    Dispense:  90 tablet    Refill:  3   labetalol (NORMODYNE) 200 MG tablet    Sig: Take 1 tablet (200 mg total) by mouth 2 (two) times daily.    Dispense:  180 tablet    Refill:  3    Patient Instructions  Medication Instructions:  Please discontinue your Hydralazine. Start Amlodipine 5  mg once a day and Labetalol 200 mg one tablet twice a day. Continue all other medications as listed.    *If you need a refill on your cardiac medications before your next appointment, please call your pharmacy*  Lab Work: None If you have labs (blood work) drawn today and your tests are completely normal, you will receive your results only by: MyChart Message (if you have MyChart) OR A paper copy in the mail If you have any lab test that is abnormal or we need to change your treatment, we will call you to review the results.  You have been referred to the Hypertension Clinic at [facility].   Testing/Procedures: Your physician has requested that you have an echocardiogram. Echocardiography is a painless test that uses sound waves to create images of your heart. It provides your doctor with information about the size and shape of your heart and how well your heart's chambers and valves are working. This procedure takes approximately one hour. There are no restrictions for this procedure. Please do NOT wear cologne, perfume, aftershave, or lotions (deodorant is allowed). Please arrive 15 minutes prior to your appointment time.  Follow-Up: At [facility], you and your health needs are our priority.  As part of our continuing mission to provide you with exceptional heart care, we have created designated Provider Care Teams.  These Care Teams include your primary Cardiologist (physician) and Advanced Practice Providers (APPs -  Physician Assistants and Nurse Practitioners) who all work together to provide you with the care you need, when you need it.  We recommend signing up for the  patient portal called "MyChart".  Sign up information is provided on this After Visit Summary.  MyChart is used to connect with patients for Virtual Visits (Telemedicine).  Patients are able to view lab/test results, encounter notes, upcoming appointments, etc.  Non-urgent messages can be sent to your provider as well.   To learn more about what you can do with MyChart, go to [email or web address].    Your next appointment:   Follow up after being seen in Hypertension Clinic at [facility] office.      Signed, Donato Schultz, MD  06/17/2023 3:43 PM    [facility]

## 2023-06-20 ENCOUNTER — Ambulatory Visit (HOSPITAL_COMMUNITY): Payer: Medicare HMO | Attending: Cardiology

## 2023-06-20 DIAGNOSIS — R079 Chest pain, unspecified: Secondary | ICD-10-CM | POA: Diagnosis not present

## 2023-06-20 LAB — ECHOCARDIOGRAM COMPLETE
Area-P 1/2: 3.89 cm2
S' Lateral: 3 cm

## 2023-06-24 ENCOUNTER — Telehealth: Payer: Self-pay | Admitting: Cardiology

## 2023-06-24 NOTE — Telephone Encounter (Signed)
 Patient is returning call to discuss echo results. °

## 2023-06-24 NOTE — Telephone Encounter (Signed)
Pt advised her Echo results.  

## 2023-06-25 ENCOUNTER — Other Ambulatory Visit: Payer: Medicare HMO

## 2023-07-03 ENCOUNTER — Ambulatory Visit
Admission: RE | Admit: 2023-07-03 | Discharge: 2023-07-03 | Disposition: A | Discharge status: Home / Self Care | Payer: Medicare HMO | Source: Ambulatory Visit | Attending: Student | Admitting: Student

## 2023-07-03 DIAGNOSIS — I10 Essential (primary) hypertension: Secondary | ICD-10-CM

## 2023-07-10 ENCOUNTER — Ambulatory Visit: Payer: Medicare HMO | Attending: Cardiology

## 2023-07-10 NOTE — Progress Notes (Deleted)
Patient ID: AIMSLEY WALSTAD                 DOB: 1961-04-25                      MRN: [id number]      HPI: Diana Mendez is a 62 y.o. female referred by Dr. Anne Fu to HTN clinic. PMH is significant for T2DM, HLD, HTN, anxiety, depression.  Patient seen in ED for hypertensive urgency on 05/27/23. Sent home on hydralazine 10 mg BID, amlodipine 5 mg daily, valsartan/hydrochlorothiazide 320/25 mg daily. At last visit on 06/17/23 with Dr. Anne Fu BP was uncontrolled and pt was restarted on amlodipine 5 mg daily and labetalol 200 mg BID.  Current HTN meds: amlodipine 5 mg daily, labetalol 200 mg twice daily Previously tried: lisinopril (diarrhea), hydralazine, valsartan/hydrochlorothiazide, losartan BP goal: <130/80 mm Hg  Family History: Diabetes in her mother; Hypertension in her mother; Leukemia in her daughter and granddaughter; Lymphoma in her son.   Social History:  Former smoker  Diet:   Exercise:  {types:28256}  Home BP readings:  Date SBP/DBP  HR                              Average      Wt Readings from Last 3 Encounters:  06/17/23 149 lb 6.4 oz (67.8 kg)  05/27/23 148 lb (67.1 kg)  05/22/23 148 lb (67.1 kg)   BP Readings from Last 3 Encounters:  06/17/23 (!) 204/100  05/27/23 (!) 156/93  05/22/23 (!) 185/103   Pulse Readings from Last 3 Encounters:  06/17/23 91  05/27/23 83  05/22/23 75    Renal function: CrCl cannot be calculated (Patient's most recent lab result is older than the maximum 21 days allowed.).  Past Medical History:  Diagnosis Date   Anxiety    Asthma    Depression    Diabetes mellitus without complication (HCC)    High cholesterol    Hypertension     Current Outpatient Medications on File Prior to Visit  Medication Sig Dispense Refill   albuterol (VENTOLIN HFA) 108 (90 Base) MCG/ACT inhaler Inhale 1 puff into the lungs in the morning and at bedtime.     amLODipine (NORVASC) 5 MG tablet Take 1 tablet (5 mg total) by mouth daily.  90 tablet 3   ARIPiprazole (ABILIFY) 5 MG tablet TAKE ONE TABLET BY MOUTH DAILY AT 9AM 30 tablet 11   escitalopram (LEXAPRO) 10 MG tablet TAKE ONE TABLET BY MOUTH DAILY AT 9AM 30 tablet 11   fluticasone furoate-vilanterol (BREO ELLIPTA) 200-25 MCG/INH AEPB Inhale 1 puff into the lungs daily.     GABAPENTIN PO Take by mouth.     hydrOXYzine (ATARAX) 10 MG tablet TAKE ONE TABLET BY MOUTH THREE TIMES DAILY AS NEEDED (VIAL) 90 tablet 11   labetalol (NORMODYNE) 200 MG tablet Take 1 tablet (200 mg total) by mouth 2 (two) times daily. 180 tablet 3   metFORMIN (GLUCOPHAGE) 500 MG tablet Take 500 mg by mouth 2 (two) times daily with a meal.     rosuvastatin (CRESTOR) 10 MG tablet Take 10 mg by mouth at bedtime.     traZODone (DESYREL) 100 MG tablet TAKE 1 TABLET(100 MG) BY MOUTH AT BEDTIME (Patient taking differently: 150 mg.) 30 tablet 2   No current facility-administered medications on file prior to visit.    Allergies  Allergen Reactions   Flexeril [Cyclobenzaprine] Anaphylaxis  Lisinopril Diarrhea    There were no vitals taken for this visit.   Assessment/Plan: No BP recorded.  {Refresh Note OR Click here to enter BP  :1}***   1. Hypertension -  No problem-specific Assessment & Plan notes found for this encounter.      Thank you  Olene Floss, Pharm.D, BCPS, CPP [facility] of [facility] [address], [place], [place] [address]  Phone: [phone]; Fax: [phone]

## 2023-09-05 ENCOUNTER — Other Ambulatory Visit (HOSPITAL_COMMUNITY): Payer: Self-pay | Admitting: Psychiatry

## 2023-09-05 DIAGNOSIS — F411 Generalized anxiety disorder: Secondary | ICD-10-CM

## 2023-09-05 DIAGNOSIS — F330 Major depressive disorder, recurrent, mild: Secondary | ICD-10-CM
